# Patient Record
Sex: Male | Born: 1960 | Race: White | Hispanic: No | Marital: Married | Smoking: Current every day smoker
Health system: Southern US, Community
[De-identification: ages and names within clinical notes are randomized; demographics above are authoritative.]

## PROBLEM LIST (undated history)

## (undated) DIAGNOSIS — H905 Unspecified sensorineural hearing loss: Secondary | ICD-10-CM

## (undated) DIAGNOSIS — E559 Vitamin D deficiency, unspecified: Secondary | ICD-10-CM

## (undated) DIAGNOSIS — E119 Type 2 diabetes mellitus without complications: Secondary | ICD-10-CM

## (undated) DIAGNOSIS — K4091 Unilateral inguinal hernia, without obstruction or gangrene, recurrent: Secondary | ICD-10-CM

## (undated) DIAGNOSIS — G4730 Sleep apnea, unspecified: Secondary | ICD-10-CM

## (undated) DIAGNOSIS — T8859XA Other complications of anesthesia, initial encounter: Secondary | ICD-10-CM

## (undated) DIAGNOSIS — T4145XA Adverse effect of unspecified anesthetic, initial encounter: Secondary | ICD-10-CM

## (undated) DIAGNOSIS — K760 Fatty (change of) liver, not elsewhere classified: Secondary | ICD-10-CM

## (undated) DIAGNOSIS — E039 Hypothyroidism, unspecified: Secondary | ICD-10-CM

## (undated) HISTORY — PX: COLONOSCOPY: SHX174

## (undated) HISTORY — PX: OTHER SURGICAL HISTORY: SHX169

## (undated) HISTORY — PX: MOUTH SURGERY: SHX715

---

## 1993-12-02 HISTORY — PX: HERNIA REPAIR: SHX51

## 2012-07-04 HISTORY — PX: MYRINGOTOMY WITH TUBE PLACEMENT: SHX5663

## 2013-07-01 ENCOUNTER — Emergency Department (HOSPITAL_COMMUNITY): Payer: Worker's Compensation

## 2013-07-01 ENCOUNTER — Emergency Department (HOSPITAL_COMMUNITY)
Admission: EM | Admit: 2013-07-01 | Discharge: 2013-07-01 | Disposition: A | Discharge status: Home / Self Care | Payer: Worker's Compensation | Attending: Emergency Medicine | Admitting: Emergency Medicine

## 2013-07-01 ENCOUNTER — Encounter (HOSPITAL_COMMUNITY): Payer: Self-pay | Admitting: Emergency Medicine

## 2013-07-01 DIAGNOSIS — W19XXXA Unspecified fall, initial encounter: Secondary | ICD-10-CM

## 2013-07-01 DIAGNOSIS — W1809XA Striking against other object with subsequent fall, initial encounter: Secondary | ICD-10-CM | POA: Insufficient documentation

## 2013-07-01 DIAGNOSIS — S0101XA Laceration without foreign body of scalp, initial encounter: Secondary | ICD-10-CM

## 2013-07-01 DIAGNOSIS — S7000XA Contusion of unspecified hip, initial encounter: Secondary | ICD-10-CM | POA: Insufficient documentation

## 2013-07-01 DIAGNOSIS — Y9289 Other specified places as the place of occurrence of the external cause: Secondary | ICD-10-CM | POA: Insufficient documentation

## 2013-07-01 DIAGNOSIS — Y990 Civilian activity done for income or pay: Secondary | ICD-10-CM | POA: Insufficient documentation

## 2013-07-01 DIAGNOSIS — Y939 Activity, unspecified: Secondary | ICD-10-CM | POA: Insufficient documentation

## 2013-07-01 DIAGNOSIS — S7001XA Contusion of right hip, initial encounter: Secondary | ICD-10-CM

## 2013-07-01 DIAGNOSIS — F17200 Nicotine dependence, unspecified, uncomplicated: Secondary | ICD-10-CM | POA: Insufficient documentation

## 2013-07-01 DIAGNOSIS — S0100XA Unspecified open wound of scalp, initial encounter: Secondary | ICD-10-CM | POA: Insufficient documentation

## 2013-07-01 DIAGNOSIS — S060X1A Concussion with loss of consciousness of 30 minutes or less, initial encounter: Secondary | ICD-10-CM | POA: Insufficient documentation

## 2013-07-01 NOTE — ED Notes (Signed)
Pt with fall on ice while at work just PTA. Pt alert, oriented x4, VSS. Lac to posterior scalp. No blood thinners. +LOC with injury.

## 2013-07-01 NOTE — ED Provider Notes (Signed)
CSN: [id number]     Arrival date & time 07/01/13  1244 History   First MD Initiated Contact with Patient 07/01/13 1253     Chief Complaint  Patient presents with  . Head Injury   (Consider location/radiation/quality/duration/timing/severity/associated sxs/prior Treatment) Patient is a 52 y.o. male presenting with head injury.  Head Injury  Pt with no significant PMH reports he was working at [employer] today when he slipped on a piece of plastic covering the ice floor. He fell backwards, striking his head. Had brief LOC, back to baseline now. Complaining of moderate aching headache and mild aching pain in R hip.   History reviewed. No pertinent past medical history. History reviewed. No pertinent past surgical history. History reviewed. No pertinent family history. History  Substance Use Topics  . Smoking status: Current Every Day Smoker -- 1.00 packs/day  . Smokeless tobacco: Not on file  . Alcohol Use: Yes     Comment: occasional    Review of Systems All other systems reviewed and are negative except as noted in HPI.   Allergies  Review of patient's allergies indicates no known allergies.  Home Medications   Current Outpatient Rx  Name  Route  Sig  Dispense  Refill  . naproxen sodium (ANAPROX) 220 MG tablet   Oral   Take 440 mg by mouth every 8 (eight) hours as needed (for pain).          BP 127/80  Pulse 93  Temp(Src) 98.2 F (36.8 C) (Oral)  Resp 18  SpO2 96% Physical Exam  Nursing note and vitals reviewed. Constitutional: He is oriented to person, place, and time. He appears well-developed and well-nourished.  HENT:  Head: Normocephalic.  Posterior scalp laceration  Eyes: EOM are normal. Pupils are equal, round, and reactive to light.  Neck: Normal range of motion. Neck supple.  Cardiovascular: Normal rate, normal heart sounds and intact distal pulses.   Pulmonary/Chest: Effort normal and breath sounds normal.  Abdominal: Bowel sounds are normal. He  exhibits no distension. There is no tenderness.  Musculoskeletal: Normal range of motion. He exhibits no edema and no tenderness.  Neurological: He is alert and oriented to person, place, and time. He has normal strength. No cranial nerve deficit or sensory deficit.  Skin: Skin is warm and dry. No rash noted.  Psychiatric: He has a normal mood and affect.    ED Course  Procedures (including critical care time)  LACERATION REPAIR Performed by: Pollyann Savoy. Consent: Verbal consent obtained. Risks and benefits: risks, benefits and alternatives were discussed Patient identity confirmed: provided demographic data Time out performed prior to procedure Prepped and Draped in normal sterile fashion Wound explored Laceration Location: posterior scalp Laceration Length: 4cm No Foreign Bodies seen or palpated Anesthesia: local infiltration Local anesthetic: lidocaine 2% no epinephrine Anesthetic total: 3 ml Irrigation method: syringe Amount of cleaning: standard Skin closure: staples Number of sutures or staples: 3 Technique: staples Patient tolerance: Patient tolerated the procedure well with no immediate complications.  Labs Review Labs Reviewed - No data to display Imaging Review Dg Hip Complete Right  07/01/2013   CLINICAL DATA:  Pain status post fall  EXAM: RIGHT HIP - COMPLETE 2+ VIEW  COMPARISON:  None.  FINDINGS: There is no evidence of hip fracture or dislocation. There is no evidence of arthropathy or other focal bone abnormality.  IMPRESSION: Negative.   Electronically Signed   By: Salome Holmes M.D.   On: 07/01/2013 13:56   Ct Head Wo Contrast  07/01/2013   CLINICAL DATA:  Fall.  Trauma to head.  EXAM: CT HEAD WITHOUT CONTRAST  TECHNIQUE: Contiguous axial images were obtained from the base of the skull through the vertex without intravenous contrast.  COMPARISON:  None.  FINDINGS: No acute infarct, hemorrhage, or mass lesion is present. The ventricles are of normal  size. No significant extra-axial mass lesion or hemorrhage is evident.  A scalp hematoma and laceration is noted in the high right parietal scalp near the vertex. There is no underlying fracture.  The paranasal sinuses are clear. There is minimal fluid in the left mastoid air cells. The right mastoid air cells are somewhat sclerotic.  IMPRESSION: 1. High right parietal scalp laceration and hematoma without underlying fracture. 2. Normal CT appearance of the brain. No acute intracranial abnormality. 3. Sclerotic changes of the right mastoid suggesting a history of chronic disease.   Electronically Signed   By: Gennette Pac M.D.   On: 07/01/2013 13:46    EKG Interpretation   None       MDM   1. Fall, initial encounter   2. Scalp laceration, initial encounter   3. Concussion, with loss of consciousness of 30 minutes or less, initial encounter   4. Contusion of right hip, initial encounter    Imaging neg. Pt at baseline. Given head injury precautions and wound care instructions.     Tokiko Diefenderfer B. Bernette Mayers, MD 07/01/13 [phone]

## 2013-07-01 NOTE — ED Notes (Signed)
Pt states that the right side is tender from where he fell. Pt had a tetanus shot last year in November.

## 2013-07-01 NOTE — ED Notes (Signed)
Dressing applied to stapled area.

## 2013-07-01 NOTE — ED Notes (Addendum)
Pt complaining of pain in the right foot that he landed on when falling earlier today. Palpable raised area, slightly tender to touch. MD informed.

## 2014-04-08 ENCOUNTER — Other Ambulatory Visit: Payer: Self-pay | Admitting: Family Medicine

## 2014-04-08 DIAGNOSIS — R945 Abnormal results of liver function studies: Principal | ICD-10-CM

## 2014-04-08 DIAGNOSIS — R7989 Other specified abnormal findings of blood chemistry: Secondary | ICD-10-CM

## 2014-04-09 ENCOUNTER — Ambulatory Visit
Admission: RE | Admit: 2014-04-09 | Discharge: 2014-04-09 | Disposition: A | Discharge status: Home / Self Care | Payer: BC Managed Care – PPO | Source: Ambulatory Visit | Attending: Family Medicine | Admitting: Family Medicine

## 2014-04-09 DIAGNOSIS — R7989 Other specified abnormal findings of blood chemistry: Secondary | ICD-10-CM

## 2014-04-09 DIAGNOSIS — R945 Abnormal results of liver function studies: Principal | ICD-10-CM

## 2014-09-09 NOTE — Progress Notes (Signed)
Please put orders in Epic surgery 09-22-14 pre op 09-12-14 Thanks

## 2014-09-10 NOTE — Patient Instructions (Addendum)
Girtha RmKelly W Vertz  09/10/2014   Your procedure is scheduled on: Monday 09/22/2014  [facility]  [facility] and follow signs to               Short Stay Center at 0530 AM.  Call this number if you have problems the morning of surgery [phone]   Remember: DO NOT TAKE DIABETIC MEDICATIONS MORNING OF SURGERY!  Do not eat food or drink liquids :After Midnight.     Take these medicines the morning of surgery with A SIP OF WATER: Levothyroxine                               You may not have any metal on your body including hair pins and              piercings  Do not wear jewelry, make-up, lotions, powders or perfumes.             Do not wear nail polish.  Do not shave  48 hours prior to surgery.              Men may shave face and neck.   Do not bring valuables to the hospital. [facility] IS NOT             RESPONSIBLE   FOR VALUABLES.  Contacts, dentures or bridgework may not be worn into surgery.  Leave suitcase in the car. After surgery it may be brought to your room.     Patients discharged the day of surgery will not be allowed to drive home.  Name and phone number of your driver:  Special Instructions: N/A              Please read over the following fact sheets you were given: _____________________________________________________________________             [facility] - Preparing for [facility] Before surgery, you can play an important role.  Because skin is not sterile, your skin needs to be as free of germs as possible.  You can reduce the number of germs on your skin by washing with CHG (chlorahexidine gluconate) soap before surgery.  CHG is an antiseptic cleaner which kills germs and bonds with the skin to continue killing germs even after washing. Please DO NOT use if you have an allergy to CHG or antibacterial soaps.  If your skin becomes reddened/irritated stop using the CHG and inform your nurse when you arrive at Short Stay. Do not  shave (including legs and underarms) for at least 48 hours prior to the first CHG shower.  You may shave your face/neck. Please follow these instructions carefully:  1.  Shower with CHG Soap the night before surgery and the  morning of Surgery.  2.  If you choose to wash your hair, wash your hair first as usual with your  normal  shampoo.  3.  After you shampoo, rinse your hair and body thoroughly to remove the  shampoo.                           4.  Use CHG as you would any other liquid soap.  You can apply chg directly  to the skin and wash  Gently with a scrungie or clean washcloth.  5.  Apply the CHG Soap to your body ONLY FROM THE NECK DOWN.   Do not use on face/ open                           Wound or open sores. Avoid contact with eyes, ears mouth and genitals (private parts).                       Wash face,  Genitals (private parts) with your normal soap.             6.  Wash thoroughly, paying special attention to the area where your surgery  will be performed.  7.  Thoroughly rinse your body with warm water from the neck down.  8.  DO NOT shower/wash with your normal soap after using and rinsing off  the CHG Soap.                9.  Pat yourself dry with a clean towel.            10.  Wear clean pajamas.            11.  Place clean sheets on your bed the night of your first shower and do not  sleep with pets. Day of Surgery : Do not apply any lotions/deodorants the morning of surgery.  Please wear clean clothes to the hospital/surgery center.  FAILURE TO FOLLOW THESE INSTRUCTIONS MAY RESULT IN THE CANCELLATION OF YOUR SURGERY PATIENT SIGNATURE_________________________________  NURSE SIGNATURE__________________________________  ________________________________________________________________________   Adam Phenix  An incentive spirometer is a tool that can help keep your lungs clear and active. This tool measures how well you are filling your lungs  with each breath. Taking long deep breaths may help reverse or decrease the chance of developing breathing (pulmonary) problems (especially infection) following:  A long period of time when you are unable to move or be active. BEFORE THE PROCEDURE   If the spirometer includes an indicator to show your best effort, your nurse or respiratory therapist will set it to a desired goal.  If possible, sit up straight or lean slightly forward. Try not to slouch.  Hold the incentive spirometer in an upright position. INSTRUCTIONS FOR USE  1. Sit on the edge of your bed if possible, or sit up as far as you can in bed or on a chair. 2. Hold the incentive spirometer in an upright position. 3. Breathe out normally. 4. Place the mouthpiece in your mouth and seal your lips tightly around it. 5. Breathe in slowly and as deeply as possible, raising the piston or the ball toward the top of the column. 6. Hold your breath for 3-5 seconds or for as long as possible. Allow the piston or ball to fall to the bottom of the column. 7. Remove the mouthpiece from your mouth and breathe out normally. 8. Rest for a few seconds and repeat Steps 1 through 7 at least 10 times every 1-2 hours when you are awake. Take your time and take a few normal breaths between deep breaths. 9. The spirometer may include an indicator to show your best effort. Use the indicator as a goal to work toward during each repetition. 10. After each set of 10 deep breaths, practice coughing to be sure your lungs are clear. If you have an incision (the cut made at the time of surgery),  support your incision when coughing by placing a pillow or rolled up towels firmly against it. Once you are able to get out of bed, walk around indoors and cough well. You may stop using the incentive spirometer when instructed by your caregiver.  RISKS AND COMPLICATIONS  Take your time so you do not get dizzy or light-headed.  If you are in pain, you may need to  take or ask for pain medication before doing incentive spirometry. It is harder to take a deep breath if you are having pain. AFTER USE  Rest and breathe slowly and easily.  It can be helpful to keep track of a log of your progress. Your caregiver can provide you with a simple table to help with this. If you are using the spirometer at home, follow these instructions: [place] IF:   You are having difficultly using the spirometer.  You have trouble using the spirometer as often as instructed.  Your pain medication is not giving enough relief while using the spirometer.  You develop fever of 100.5 F (38.1 C) or higher. SEEK IMMEDIATE MEDICAL CARE IF:   You cough up bloody sputum that had not been present before.  You develop fever of 102 F (38.9 C) or greater.  You develop worsening pain at or near the incision site. MAKE SURE YOU:   Understand these instructions.  Will watch your condition.  Will get help right away if you are not doing well or get worse. Document Released: 10/31/2006 Document Revised: 09/12/2011 Document Reviewed: 01/01/2007 [facility] Patient Information 2014 [place], [place].   ________________________________________________________________________

## 2014-09-12 ENCOUNTER — Other Ambulatory Visit: Payer: Self-pay

## 2014-09-12 ENCOUNTER — Encounter (HOSPITAL_COMMUNITY): Payer: Self-pay

## 2014-09-12 ENCOUNTER — Encounter (HOSPITAL_COMMUNITY)
Admission: RE | Admit: 2014-09-12 | Discharge: 2014-09-12 | Disposition: A | Discharge status: Home / Self Care | Payer: Managed Care, Other (non HMO) | Source: Ambulatory Visit | Attending: Surgery | Admitting: Surgery

## 2014-09-12 DIAGNOSIS — K4090 Unilateral inguinal hernia, without obstruction or gangrene, not specified as recurrent: Secondary | ICD-10-CM | POA: Insufficient documentation

## 2014-09-12 DIAGNOSIS — Z01810 Encounter for preprocedural cardiovascular examination: Secondary | ICD-10-CM | POA: Insufficient documentation

## 2014-09-12 DIAGNOSIS — Z01812 Encounter for preprocedural laboratory examination: Secondary | ICD-10-CM | POA: Insufficient documentation

## 2014-09-12 HISTORY — DX: Type 2 diabetes mellitus without complications: E11.9

## 2014-09-12 HISTORY — DX: Fatty (change of) liver, not elsewhere classified: K76.0

## 2014-09-12 HISTORY — DX: Hypothyroidism, unspecified: E03.9

## 2014-09-12 LAB — BASIC METABOLIC PANEL
Anion gap: 5 (ref 5–15)
BUN: 10 mg/dL (ref 6–23)
CHLORIDE: 101 mmol/L (ref 96–112)
CO2: 28 mmol/L (ref 19–32)
CREATININE: 0.95 mg/dL (ref 0.50–1.35)
Calcium: 9.7 mg/dL (ref 8.4–10.5)
GFR calc Af Amer: >90 mL/min (ref >90)
GFR calc non Af Amer: >90 mL/min (ref >90)
GLUCOSE: 165 mg/dL — AB (ref 70–99)
POTASSIUM: 5.1 mmol/L (ref 3.5–5.1)
Sodium: 134 mmol/L — ABNORMAL LOW (ref 135–145)

## 2014-09-12 LAB — CBC
HEMATOCRIT: 47.7 % (ref 39.0–52.0)
Hemoglobin: 16 g/dL (ref 13.0–17.0)
MCH: 30.9 pg (ref 26.0–34.0)
MCHC: 33.5 g/dL (ref 30.0–36.0)
MCV: 92.3 fL (ref 78.0–100.0)
Platelets: 186 10*3/uL (ref 150–400)
RBC: 5.17 MIL/uL (ref 4.22–5.81)
RDW: 12.7 % (ref 11.5–15.5)
WBC: 7.8 10*3/uL (ref 4.0–10.5)

## 2014-09-12 NOTE — Progress Notes (Signed)
   09/12/14 0849  OBSTRUCTIVE SLEEP APNEA  Have you ever been diagnosed with sleep apnea through a sleep study? No  Do you snore loudly (loud enough to be heard through closed doors)?  1  Do you often feel tired, fatigued, or sleepy during the daytime? 1  Has anyone observed you stop breathing during your sleep? 0  Do you have, or are you being treated for high blood pressure? 0  BMI more than 35 kg/m2? 0  Age over 54 years old? 1  Neck circumference greater than 40 cm/16 inches? 1  Gender: 1  Obstructive Sleep Apnea Score 5

## 2014-09-15 NOTE — Progress Notes (Signed)
[facility] as they had their pre-op appointment Friday 09/12/2014. I put in orders per Anesthesia for labs, EKG and Chest x-ray. Thank you!

## 2014-09-18 ENCOUNTER — Ambulatory Visit: Payer: Self-pay | Admitting: Surgery

## 2014-09-21 ENCOUNTER — Ambulatory Visit: Payer: Self-pay | Admitting: Surgery

## 2014-09-21 MED ORDER — DEXTROSE 5 % IV SOLN
3.0000 g | INTRAVENOUS | Status: DC
  Filled 2014-09-21: qty 3000

## 2014-09-21 NOTE — H&P (Signed)
Todd Mcneil 08/01/2014 11:31 AM Location: [facility] Patient #: [id number] DOB: 02/22/1961 Married / Language: English / Race: White Male  History of Present Illness (Todd Lasker B. [name] Todd Mcneil; 08/01/2014 12:14 PM) Patient words: New Patient- recurrent LIH Hernia.  The patient is a 53 year old male who presents for an evaluation of a hernia. The onset of the hernia has been acute (noticed it about 3 years ago and was following openbilateral hernia repairs 19 years ago.) He inquired about having a vasectomy at the same time. He has a recurrent left inguinal hernia. He wasn't clear who did the surgery but was done at the old [facility]. On physical exam he has a small bulge in the left groin that reduces partially. He says at times it will pole and is a little uncomfortable. There is no evidence of recurrent The Right Side. I Discussed Possibly Doing Him with a Preperitoneal Laparoscopic Approach Possibly Him to Do an Open Repair Depending on the Findings. He like to Have a Vasectomy at the Same Time and Maybe We Can Go Ahead and Do Bilateral Vasectomies. We Will Talk about That at the Time of Surgery. We gave him a booklet on hernia repair. I discussed the procedure with him in some detail. Plan laparoscopic possible open recurrent left inguinal herniorrhaphy   Other Problems (Todd Sakowski, Todd Mcneil; 08/01/2014 11:31 AM) Inguinal Hernia Thyroid Disease  Past Surgical History (Todd Sakowski, Todd Mcneil; 08/01/2014 11:31 AM) Open Inguinal Hernia Surgery Bilateral. Oral Surgery  Diagnostic Studies History (Todd Sakowski, Todd Mcneil; 08/01/2014 11:31 AM) Colonoscopy 1-5 years ago  Allergies (Todd Sakowski, Todd Mcneil; 08/01/2014 11:32 AM) No Known Drug Allergies01/29/2016  Medication History (Todd Sakowski, Todd Mcneil; 08/01/2014 11:33 AM) MetFORMIN HCl (500MG Tablet, Oral two times daily) Active. Levothyroxine Sodium (88MCG Tablet, Oral daily) Active. Multivitamins (Oral  daily) Active.  Social History (Todd Sakowski, Todd Mcneil; 08/01/2014 11:31 AM) Alcohol use Occasional alcohol use. Caffeine use Coffee. Illicit drug use Prefer to discuss with provider. Tobacco use Current some day smoker.  Family History (Todd Sakowski, Todd Mcneil; 08/01/2014 11:31 AM) Arthritis Father. Cancer Father, Mother. Heart Disease Father. Heart disease in male family member before age 55 Hypertension Mother. Kidney Disease Mother. Melanoma Father.  Review of Systems (Todd Sakowski Todd Mcneil; 08/01/2014 11:31 AM) General Not Present- Appetite Loss, Chills, Fatigue, Fever, Night Sweats, Weight Gain and Weight Loss. Skin Not Present- Change in Wart/Mole, Dryness, Hives, Jaundice, New Lesions, Non-Healing Wounds, Rash and Ulcer. HEENT Present- Seasonal Allergies and Wears glasses/contact lenses. Not Present- Earache, Hearing Loss, Hoarseness, Nose Bleed, Oral Ulcers, Ringing in the Ears, Sinus Pain, Sore Throat, Visual Disturbances and Yellow Eyes. Respiratory Present- Snoring. Not Present- Bloody sputum, Chronic Cough, Difficulty Breathing and Wheezing. Breast Not Present- Breast Mass, Breast Pain, Nipple Discharge and Skin Changes. Cardiovascular Not Present- Chest Pain, Difficulty Breathing Lying Down, Leg Cramps, Palpitations, Rapid Heart Rate, Shortness of Breath and Swelling of Extremities. Gastrointestinal Present- Abdominal Pain and Excessive gas. Not Present- Bloating, Bloody Stool, Change in Bowel Habits, Chronic diarrhea, Constipation, Difficulty Swallowing, Gets full quickly at meals, Hemorrhoids, Indigestion, Nausea, Rectal Pain and Vomiting. Musculoskeletal Not Present- Back Pain, Joint Pain, Joint Stiffness, Muscle Pain, Muscle Weakness and Swelling of Extremities. Neurological Not Present- Decreased Memory, Fainting, Headaches, Numbness, Seizures, Tingling, Tremor, Trouble walking and Weakness. Psychiatric Not Present- Anxiety, Bipolar, Change in Sleep Pattern, Depression,  Fearful and Frequent crying. Endocrine Present- New Diabetes. Not Present- Cold Intolerance, Excessive Hunger, Hair Changes, Heat Intolerance and Hot flashes. Hematology Not Present- Easy Bruising,   Excessive bleeding, Gland problems, HIV and Persistent Infections.   Vitals (Todd Sakowski Todd Mcneil; 08/01/2014 11:32 AM) 08/01/2014 11:31 AM Weight: 281 lb Height: 76in Body Surface Area: 2.61 m Body Mass Index: 34.2 kg/m Temp.: 97.8F(Oral)  Pulse: 72 (Regular)  BP: 130/82 (Sitting, Left Arm, Standard)    Physical Exam (Todd Senat B. Sascha Baugher Todd Mcneil; 08/01/2014 12:13 PM) The physical exam findings are as follows: Note:HEENT-normal Neck-no bruits Chest-clear Heart-SR without murmurs Abdomen nontender GU-recurrent left inguinal hernia-prior bilateral open inguinal hernia repairs Ext-FROM Neuro-alert and oriented x 3.    Assessment & Plan (Todd Zagami B. 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  The patient doesn't want a vasectomy at this time.    

## 2014-09-22 ENCOUNTER — Encounter (HOSPITAL_COMMUNITY): Payer: Self-pay | Admitting: *Deleted

## 2014-09-22 ENCOUNTER — Ambulatory Visit (HOSPITAL_COMMUNITY): Payer: Managed Care, Other (non HMO) | Admitting: Anesthesiology

## 2014-09-22 ENCOUNTER — Ambulatory Visit (HOSPITAL_COMMUNITY)
Admission: RE | Admit: 2014-09-22 | Discharge: 2014-09-22 | Disposition: A | Discharge status: Home / Self Care | Payer: Managed Care, Other (non HMO) | Source: Ambulatory Visit | Attending: Surgery | Admitting: Surgery

## 2014-09-22 ENCOUNTER — Encounter (HOSPITAL_COMMUNITY)
Admission: RE | Disposition: A | Discharge status: Home / Self Care | Payer: Self-pay | Source: Ambulatory Visit | Attending: Surgery

## 2014-09-22 DIAGNOSIS — F1099 Alcohol use, unspecified with unspecified alcohol-induced disorder: Secondary | ICD-10-CM | POA: Insufficient documentation

## 2014-09-22 DIAGNOSIS — F1590 Other stimulant use, unspecified, uncomplicated: Secondary | ICD-10-CM | POA: Insufficient documentation

## 2014-09-22 DIAGNOSIS — E119 Type 2 diabetes mellitus without complications: Secondary | ICD-10-CM | POA: Diagnosis not present

## 2014-09-22 DIAGNOSIS — F17210 Nicotine dependence, cigarettes, uncomplicated: Secondary | ICD-10-CM | POA: Insufficient documentation

## 2014-09-22 DIAGNOSIS — K4031 Unilateral inguinal hernia, with obstruction, without gangrene, recurrent: Secondary | ICD-10-CM | POA: Insufficient documentation

## 2014-09-22 HISTORY — PX: INGUINAL HERNIA REPAIR: SHX194

## 2014-09-22 LAB — GLUCOSE, CAPILLARY
Glucose-Capillary: 226 mg/dL — ABNORMAL HIGH (ref 70–99)
Glucose-Capillary: 229 mg/dL — ABNORMAL HIGH (ref 70–99)

## 2014-09-22 SURGERY — REPAIR, HERNIA, INGUINAL, LAPAROSCOPIC
Anesthesia: General | Site: Abdomen | Laterality: Left

## 2014-09-22 MED ORDER — DEXAMETHASONE SODIUM PHOSPHATE 10 MG/ML IJ SOLN
INTRAMUSCULAR | Status: AC
  Filled 2014-09-22: qty 1

## 2014-09-22 MED ORDER — FENTANYL CITRATE 0.05 MG/ML IJ SOLN
INTRAMUSCULAR | Status: AC
  Filled 2014-09-22: qty 5

## 2014-09-22 MED ORDER — HEPARIN SODIUM (PORCINE) 5000 UNIT/ML IJ SOLN: 5000.0000 [IU] | Freq: Once | INTRAMUSCULAR | Status: DC

## 2014-09-22 MED ORDER — LACTATED RINGERS IR SOLN
Status: DC | PRN
  Administered 2014-09-22: 1000 mL

## 2014-09-22 MED ORDER — ONDANSETRON HCL 4 MG/2ML IJ SOLN
INTRAMUSCULAR | Status: AC
  Filled 2014-09-22: qty 2

## 2014-09-22 MED ORDER — ROCURONIUM BROMIDE 100 MG/10ML IV SOLN
INTRAVENOUS | Status: DC | PRN
  Administered 2014-09-22 (×2): 10 mg via INTRAVENOUS
  Administered 2014-09-22: 30 mg via INTRAVENOUS
  Administered 2014-09-22: 10 mg via INTRAVENOUS

## 2014-09-22 MED ORDER — ROCURONIUM BROMIDE 100 MG/10ML IV SOLN
INTRAVENOUS | Status: AC
  Filled 2014-09-22: qty 1

## 2014-09-22 MED ORDER — LACTATED RINGERS IV SOLN: INTRAVENOUS | Status: DC

## 2014-09-22 MED ORDER — HEPARIN SODIUM (PORCINE) 5000 UNIT/ML IJ SOLN
5000.0000 [IU] | Freq: Once | INTRAMUSCULAR | Status: AC
  Administered 2014-09-22: 5000 [IU] via SUBCUTANEOUS
  Filled 2014-09-22: qty 1

## 2014-09-22 MED ORDER — LACTATED RINGERS IV SOLN
INTRAVENOUS | Status: DC | PRN
Start: 2014-09-22 — End: 2014-09-22
  Administered 2014-09-22 (×2): via INTRAVENOUS

## 2014-09-22 MED ORDER — HYDROMORPHONE HCL 1 MG/ML IJ SOLN
0.2500 mg | INTRAMUSCULAR | Status: DC | PRN
  Administered 2014-09-22 (×4): 0.5 mg via INTRAVENOUS

## 2014-09-22 MED ORDER — GLYCOPYRROLATE 0.2 MG/ML IJ SOLN
INTRAMUSCULAR | Status: AC
  Filled 2014-09-22: qty 2

## 2014-09-22 MED ORDER — PROPOFOL 10 MG/ML IV BOLUS
INTRAVENOUS | Status: DC | PRN
  Administered 2014-09-22: 200 mg via INTRAVENOUS

## 2014-09-22 MED ORDER — MIDAZOLAM HCL 2 MG/2ML IJ SOLN
INTRAMUSCULAR | Status: AC
  Filled 2014-09-22: qty 2

## 2014-09-22 MED ORDER — 0.9 % SODIUM CHLORIDE (POUR BTL) OPTIME
TOPICAL | Status: DC | PRN
  Administered 2014-09-22: 1000 mL

## 2014-09-22 MED ORDER — BUPIVACAINE LIPOSOME 1.3 % IJ SUSP
20.0000 mL | Freq: Once | INTRAMUSCULAR | Status: DC
  Filled 2014-09-22: qty 20

## 2014-09-22 MED ORDER — HYDROMORPHONE HCL 1 MG/ML IJ SOLN
INTRAMUSCULAR | Status: AC
  Filled 2014-09-22: qty 1

## 2014-09-22 MED ORDER — DEXTROSE 5 % IV SOLN
3.0000 g | INTRAVENOUS | Status: AC
  Administered 2014-09-22: 3 g via INTRAVENOUS
  Filled 2014-09-22: qty 3000

## 2014-09-22 MED ORDER — SUCCINYLCHOLINE CHLORIDE 20 MG/ML IJ SOLN
INTRAMUSCULAR | Status: DC | PRN
  Administered 2014-09-22: 100 mg via INTRAVENOUS

## 2014-09-22 MED ORDER — FENTANYL CITRATE 0.05 MG/ML IJ SOLN
INTRAMUSCULAR | Status: DC | PRN
  Administered 2014-09-22: 100 ug via INTRAVENOUS
  Administered 2014-09-22 (×8): 50 ug via INTRAVENOUS

## 2014-09-22 MED ORDER — PROPOFOL 10 MG/ML IV BOLUS
INTRAVENOUS | Status: AC
  Filled 2014-09-22: qty 20

## 2014-09-22 MED ORDER — GLYCOPYRROLATE 0.2 MG/ML IJ SOLN
INTRAMUSCULAR | Status: DC | PRN
  Administered 2014-09-22: 0.6 mg via INTRAVENOUS

## 2014-09-22 MED ORDER — CHLORHEXIDINE GLUCONATE 4 % EX LIQD: 1.0000 "application " | Freq: Once | CUTANEOUS | Status: DC

## 2014-09-22 MED ORDER — SODIUM CHLORIDE 0.9 % IJ SOLN: 3.0000 mL | INTRAMUSCULAR | Status: DC | PRN

## 2014-09-22 MED ORDER — ACETAMINOPHEN 325 MG PO TABS: 650.0000 mg | ORAL_TABLET | ORAL | Status: DC | PRN

## 2014-09-22 MED ORDER — ONDANSETRON HCL 4 MG/2ML IJ SOLN: 4.0000 mg | Freq: Once | INTRAMUSCULAR | Status: DC | PRN

## 2014-09-22 MED ORDER — SODIUM CHLORIDE 0.9 % IJ SOLN: 3.0000 mL | Freq: Two times a day (BID) | INTRAMUSCULAR | Status: DC

## 2014-09-22 MED ORDER — LIDOCAINE HCL (CARDIAC) 20 MG/ML IV SOLN
INTRAVENOUS | Status: AC
  Filled 2014-09-22: qty 5

## 2014-09-22 MED ORDER — MIDAZOLAM HCL 5 MG/5ML IJ SOLN
INTRAMUSCULAR | Status: DC | PRN
  Administered 2014-09-22 (×2): 1 mg via INTRAVENOUS

## 2014-09-22 MED ORDER — ACETAMINOPHEN 650 MG RE SUPP
650.0000 mg | RECTAL | Status: DC | PRN
  Filled 2014-09-22: qty 1

## 2014-09-22 MED ORDER — OXYCODONE HCL 5 MG PO TABS
5.0000 mg | ORAL_TABLET | ORAL | Status: DC | PRN
  Administered 2014-09-22 (×2): 5 mg via ORAL
  Filled 2014-09-22 (×2): qty 1

## 2014-09-22 MED ORDER — CISATRACURIUM BESYLATE 20 MG/10ML IV SOLN
INTRAVENOUS | Status: AC
Start: 2014-09-22 — End: 2014-09-22
  Filled 2014-09-22: qty 10

## 2014-09-22 MED ORDER — DEXAMETHASONE SODIUM PHOSPHATE 10 MG/ML IJ SOLN
INTRAMUSCULAR | Status: DC | PRN
  Administered 2014-09-22: 10 mg via INTRAVENOUS

## 2014-09-22 MED ORDER — BUPIVACAINE HCL (PF) 0.25 % IJ SOLN
INTRAMUSCULAR | Status: DC | PRN
  Administered 2014-09-22: 20 mL

## 2014-09-22 MED ORDER — NEOSTIGMINE METHYLSULFATE 10 MG/10ML IV SOLN
INTRAVENOUS | Status: DC | PRN
  Administered 2014-09-22: 4 mg via INTRAVENOUS

## 2014-09-22 MED ORDER — NEOSTIGMINE METHYLSULFATE 10 MG/10ML IV SOLN
INTRAVENOUS | Status: AC
  Filled 2014-09-22: qty 1

## 2014-09-22 MED ORDER — MEPERIDINE HCL 50 MG/ML IJ SOLN: 6.2500 mg | INTRAMUSCULAR | Status: DC | PRN

## 2014-09-22 MED ORDER — SODIUM CHLORIDE 0.9 % IV SOLN: 250.0000 mL | INTRAVENOUS | Status: DC | PRN

## 2014-09-22 MED ORDER — HYDROMORPHONE HCL 2 MG/ML IJ SOLN
INTRAMUSCULAR | Status: AC
  Filled 2014-09-22: qty 1

## 2014-09-22 MED ORDER — BUPIVACAINE HCL (PF) 0.25 % IJ SOLN
INTRAMUSCULAR | Status: AC
  Filled 2014-09-22: qty 30

## 2014-09-22 MED ORDER — HYDROCODONE-ACETAMINOPHEN 5-325 MG PO TABS: 1.0000 | ORAL_TABLET | ORAL | Status: DC | PRN

## 2014-09-22 MED ORDER — ONDANSETRON HCL 4 MG/2ML IJ SOLN
INTRAMUSCULAR | Status: DC | PRN
  Administered 2014-09-22: 4 mg via INTRAVENOUS

## 2014-09-22 MED ORDER — HYDROMORPHONE HCL 1 MG/ML IJ SOLN
INTRAMUSCULAR | Status: DC
Start: 2014-09-22 — End: 2014-09-22
  Filled 2014-09-22: qty 1

## 2014-09-22 SURGICAL SUPPLY — 45 items
APL SKNCLS STERI-STRIP NONHPOA (GAUZE/BANDAGES/DRESSINGS)
BENZOIN TINCTURE PRP APPL 2/3 (GAUZE/BANDAGES/DRESSINGS) ×1 IMPLANT
CABLE HIGH FREQUENCY MONO STRZ (ELECTRODE) ×2 IMPLANT
COVER SURGICAL LIGHT HANDLE (MISCELLANEOUS) ×1 IMPLANT
DECANTER SPIKE VIAL GLASS SM (MISCELLANEOUS) ×1 IMPLANT
DEVICE SECURE STRAP 25 ABSORB (INSTRUMENTS) IMPLANT
DISSECT BALLN SPACEMKR + OVL (BALLOONS) ×4
DISSECTOR BALLN SPACEMKR + OVL (BALLOONS) ×1 IMPLANT
DISSECTOR BLUNT TIP ENDO 5MM (MISCELLANEOUS) ×2 IMPLANT
DRAPE LAPAROSCOPIC ABDOMINAL (DRAPES) ×2 IMPLANT
DRSG TEGADERM 2-3/8X2-3/4 SM (GAUZE/BANDAGES/DRESSINGS) IMPLANT
ELECT REM PT RETURN 9FT ADLT (ELECTROSURGICAL) ×2
ELECTRODE REM PT RTRN 9FT ADLT (ELECTROSURGICAL) ×1 IMPLANT
GLOVE BIOGEL M 8.0 STRL (GLOVE) ×2 IMPLANT
GLOVE BIOGEL PI IND STRL 7.0 (GLOVE) ×1 IMPLANT
GLOVE BIOGEL PI IND STRL 7.5 (GLOVE) IMPLANT
GLOVE BIOGEL PI INDICATOR 7.0 (GLOVE) ×2
GLOVE BIOGEL PI INDICATOR 7.5 (GLOVE) ×1
GLOVE SURG SS PI 6.5 STRL IVOR (GLOVE) ×2 IMPLANT
GLOVE SURG SS PI 7.5 STRL IVOR (GLOVE) ×1 IMPLANT
GOWN SPEC L4 XLG W/TWL (GOWN DISPOSABLE) ×2 IMPLANT
GOWN STRL REUS W/TWL LRG LVL3 (GOWN DISPOSABLE) ×1 IMPLANT
GOWN STRL REUS W/TWL XL LVL3 (GOWN DISPOSABLE) ×6 IMPLANT
KIT BASIN OR (CUSTOM PROCEDURE TRAY) ×2 IMPLANT
LIQUID BAND (GAUZE/BANDAGES/DRESSINGS) ×1 IMPLANT
MARKER SKIN DUAL TIP RULER LAB (MISCELLANEOUS) ×2 IMPLANT
MESH 3DMAX LIGHT 4.1X6.2 LT LR (Mesh General) ×1 IMPLANT
NDL INSUFFLATION 14GA 120MM (NEEDLE) IMPLANT
NEEDLE INSUFFLATION 14GA 120MM (NEEDLE) IMPLANT
NS IRRIG 1000ML POUR BTL (IV SOLUTION) ×2 IMPLANT
PENCIL BUTTON HOLSTER BLD 10FT (ELECTRODE) ×2 IMPLANT
SCISSORS LAP 5X35 DISP (ENDOMECHANICALS) ×2 IMPLANT
SET IRRIG TUBING LAPAROSCOPIC (IRRIGATION / IRRIGATOR) ×2 IMPLANT
SOLUTION ANTI FOG 6CC (MISCELLANEOUS) IMPLANT
STRIP CLOSURE SKIN 1/2X4 (GAUZE/BANDAGES/DRESSINGS) ×1 IMPLANT
SUT VIC AB 3-0 SH 27 (SUTURE)
SUT VIC AB 3-0 SH 27XBRD (SUTURE) IMPLANT
SUT VIC AB 4-0 SH 18 (SUTURE) ×2 IMPLANT
SYR 30ML LL (SYRINGE) ×2 IMPLANT
TACKER 5MM HERNIA 3.5CML NAB (ENDOMECHANICALS) ×2 IMPLANT
TRAY FOLEY CATH 14FRSI W/METER (CATHETERS) ×1 IMPLANT
TRAY LAPAROSCOPIC (CUSTOM PROCEDURE TRAY) ×2 IMPLANT
TROCAR BLADELESS OPT 5 75 (ENDOMECHANICALS) ×4 IMPLANT
TUBING FILTER THERMOFLATOR (ELECTROSURGICAL) ×2 IMPLANT
TUBING INSUFFLATION 10FT LAP (TUBING) ×2 IMPLANT

## 2014-09-22 NOTE — Discharge Instructions (Signed)

## 2014-09-22 NOTE — Transfer of Care (Signed)
Immediate Anesthesia Transfer of Care Note  Patient: Todd Mcneil  Procedure(s) Performed: Procedure(s): LAPAROSCOPIC POSSIBLE OPEN LEFT INGUINAL HERNIA (Left)  Patient Location: PACU  Anesthesia Type:General  Level of Consciousness: awake, alert , oriented and patient cooperative  Airway & Oxygen Therapy: Patient Spontanous Breathing and Patient connected to face mask oxygen  Post-op Assessment: Report given to RN and Post -op Vital signs reviewed and stable  Post vital signs: Reviewed and stable  Last Vitals:  Filed Vitals:   09/22/14 0550  BP: 126/86  Pulse: 78  Temp: 36.9 C  Resp: 16    Complications: No apparent anesthesia complications

## 2014-09-22 NOTE — H&P (View-Only) (Signed)
Todd Mcneil 08/01/2014 11:31 AM Location: [facility] Patient #: [id number] DOB: 02-27-1961 Married / Language: [place] / Race: White Male  History of Present Illness Todd Mcneil B. [name] MD; 08/01/2014 12:14 PM) Patient words: New Patient- recurrent LIH Hernia.  The patient is a 54 year old male who presents for an evaluation of a hernia. The onset of the hernia has been acute (noticed it about 3 years ago and was following openbilateral hernia repairs 19 years ago.) He inquired about having a vasectomy at the same time. He has a recurrent left inguinal hernia. He wasn't clear who did the surgery but was done at the old [facility] long outpatient surgical [facility]. On physical exam he has a small bulge in the left groin that reduces partially. He says at times it will pole and is a little uncomfortable. There is no evidence of recurrent The Right Side. I Discussed Possibly Doing Him with a Preperitoneal Laparoscopic Approach Possibly Him to Do an Open Repair Depending on the Findings. He like to Have a Vasectomy at the Same Time and Maybe We Can Go Ahead and Do Bilateral Vasectomies. We Will Talk about That at the Time of Surgery. We gave him a booklet on hernia repair. I discussed the procedure with him in some detail. Plan laparoscopic possible open recurrent left inguinal herniorrhaphy   Other Problems Valli Glance, LPN; 0/34/7425 95:63 AM) Inguinal Hernia Thyroid Disease  Past Surgical History Valli Glance, LPN; 8/75/6433 29:51 AM) Open Inguinal Hernia Surgery Bilateral. Oral Surgery  Diagnostic Studies History Valli Glance, LPN; 8/84/1660 63:01 AM) Colonoscopy 1-5 years ago  Allergies Valli Glance, LPN; 12/03/930 35:57 AM) No Known Drug Allergies01/29/2016  Medication History Valli Glance, LPN; 09/22/252 27:06 AM) MetFORMIN HCl (  Tablet, Oral two times daily) Active. Levothyroxine Sodium ( Tablet, Oral daily) Active. Multivitamins (Oral  daily) Active.  Social History Valli Glance, LPN; 2/37/6283 15:17 AM) Alcohol use Occasional alcohol use. Caffeine use Coffee. Illicit drug use Prefer to discuss with provider. Tobacco use Current some day smoker.  Family History Valli Glance, LPN; 12/18/735 10:62 AM) Arthritis Father. Cancer Father, Mother. Heart Disease Father. Heart disease in male family member before age 6 Hypertension Mother. Kidney Disease Mother. Melanoma Father.  Review of Systems Todd Mcneil [facility] LPN; 6/94/8546 27:03 AM) General Not Present- Appetite Loss, Chills, Fatigue, Fever, Night Sweats, Weight Gain and Weight Loss. Skin Not Present- Change in Wart/Mole, Dryness, Hives, Jaundice, New Lesions, Non-Healing Wounds, Rash and Ulcer. HEENT Present- Seasonal Allergies and Wears glasses/contact lenses. Not Present- Earache, Hearing Loss, Hoarseness, Nose Bleed, Oral Ulcers, Ringing in the Ears, Sinus Pain, Sore Throat, Visual Disturbances and Yellow Eyes. Respiratory Present- Snoring. Not Present- Bloody sputum, Chronic Cough, Difficulty Breathing and Wheezing. Breast Not Present- Breast Mass, Breast Pain, Nipple Discharge and Skin Changes. Cardiovascular Not Present- Chest Pain, Difficulty Breathing Lying Down, Leg Cramps, Palpitations, Rapid Heart Rate, Shortness of Breath and Swelling of Extremities. Gastrointestinal Present- Abdominal Pain and Excessive gas. Not Present- Bloating, Bloody Stool, Change in Bowel Habits, Chronic diarrhea, Constipation, Difficulty Swallowing, Gets full quickly at meals, Hemorrhoids, Indigestion, Nausea, Rectal Pain and Vomiting. Musculoskeletal Not Present- Back Pain, Joint Pain, Joint Stiffness, Muscle Pain, Muscle Weakness and Swelling of Extremities. Neurological Not Present- Decreased Memory, Fainting, Headaches, Numbness, Seizures, Tingling, Tremor, Trouble walking and Weakness. Psychiatric Not Present- Anxiety, Bipolar, Change in Sleep Pattern, Depression,  Fearful and Frequent crying. Endocrine Present- New Diabetes. Not Present- Cold Intolerance, Excessive Hunger, Hair Changes, Heat Intolerance and Hot flashes. Hematology Not Present- Easy Bruising,  Excessive bleeding, Gland problems, HIV and Persistent Infections.   Vitals Todd Mcneil([facility] L[facility]; 8/65/78461/29/2016 96:2911:32 AM) 08/01/2014 11:31 AM Weight: 281 lb Height: 76in Body Surface Area: 2.61 m Body Mass Index: 34.2 kg/m Temp.: 97.81F(Oral)  Pulse: 72 (Regular)  BP: 130/82 (Sitting, Left Arm, Standard)    Physical Exam (Ryu Cerreta B. Daphine DeutscherMartin MD; 08/01/2014 12:13 PM) The physical exam findings are as follows: Note:HEENT-normal Neck-no bruits Chest-clear Heart-SR without murmurs Abdomen nontender GU-recurrent left inguinal hernia-prior bilateral open inguinal hernia repairs Ext-FROM Neuro-alert and oriented x 3.    Assessment & Plan Todd Mcneil(Amayrany Cafaro B. Daphine DeutscherMartin MD; 08/01/2014 12:15 PM) RECURRENT LEFT INGUINAL HERNIA (550.91  K40.91) Impression: will schedule laparoscopic LIH possible open repair with mesh.  The patient doesn't want a vasectomy at this time.

## 2014-09-22 NOTE — Brief Op Note (Signed)
09/22/2014  9:23 AM  PATIENT:  Girtha RmKelly W Cullimore  54 y.o. male  PRE-OPERATIVE DIAGNOSIS:  RECURRENT LEFT INGUINAL HERNIA  POST-OPERATIVE DIAGNOSIS:  RECURRENT LEFT incarcerated direct INGUINAL HERNIA  PROCEDURE:  Laparoscopic preperitoneal repair of incarerated direct LIH  SURGEON:  Surgeon(s) and Role:    * Luretha MurphyMatthew Ulices Maack, MD - Primary  PHYSICIAN ASSISTANT:   ASSISTANTS: none   ANESTHESIA:   none  EBL:  Total I/O In: 1000 [I.V.:1000] Out: 150 [Urine:100; Blood:50]  BLOOD ADMINISTERED:none  DRAINS: none   LOCAL MEDICATIONS USED:  MARCAINE     SPECIMEN:  No Specimen  DISPOSITION OF SPECIMEN:  N/A  COUNTS:  YES  TOURNIQUET:  * No tourniquets in log *  DICTATION: .Dragon Dictation and Other Dictation: Dictation Number [id number]  PLAN OF CARE: Discharge to home after PACU  PATIENT DISPOSITION:  PACU - hemodynamically stable.   Delay start of Pharmacological VTE agent (>24hrs) due to surgical blood loss or risk of bleeding: not applicable

## 2014-09-22 NOTE — Anesthesia Preprocedure Evaluation (Signed)
Anesthesia Evaluation  Patient identified by MRN, date of birth, ID band Patient awake    Reviewed: Allergy & Precautions, NPO status , Patient's Chart, lab work & pertinent test results  Airway Mallampati: II  TM Distance: >3 FB Neck ROM: Full    Dental   Pulmonary Current Smoker,          Cardiovascular     Neuro/Psych    GI/Hepatic   Endo/Other  diabetes, Type 2, Oral Hypoglycemic Agents  Renal/GU      Musculoskeletal   Abdominal   Peds  Hematology   Anesthesia Other Findings   Reproductive/Obstetrics                             Anesthesia Physical Anesthesia Plan  ASA: II  Anesthesia Plan: General   Post-op Pain Management:    Induction: Intravenous  Airway Management Planned: Oral ETT  Additional Equipment:   Intra-op Plan:   Post-operative Plan: Extubation in OR  Informed Consent: I have reviewed the patients History and Physical, chart, labs and discussed the procedure including the risks, benefits and alternatives for the proposed anesthesia with the patient or authorized representative who has indicated his/her understanding and acceptance.     Plan Discussed with: CRNA and Surgeon  Anesthesia Plan Comments:         Anesthesia Quick Evaluation

## 2014-09-22 NOTE — Anesthesia Procedure Notes (Addendum)
Procedure Name: Intubation Date/Time: 09/22/2014 7:24 AM Performed by: Delphia GratesHANDLER, Jann Milkovich Pre-anesthesia Checklist: Patient identified, Emergency Drugs available, Suction available and Patient being monitored Patient Re-evaluated:Patient Re-evaluated prior to inductionOxygen Delivery Method: Circle system utilized Intubation Type: IV induction Laryngoscope Size: Mac and 4 Grade View: Grade I Tube type: Oral Tube size: 7.5 mm Number of attempts: 1 Airway Equipment and Method: Stylet Placement Confirmation: ETT inserted through vocal cords under direct vision and breath sounds checked- equal and bilateral Secured at: 21 cm Tube secured with: Tape Dental Injury: Teeth and Oropharynx as per pre-operative assessment  Comments: Poor dentition, unchanged after intubation.

## 2014-09-22 NOTE — Anesthesia Postprocedure Evaluation (Signed)
Anesthesia Post Note  Patient: Todd Mcneil  Procedure(s) Performed: Procedure(s) (LRB): LAPAROSCOPIC POSSIBLE OPEN LEFT INGUINAL HERNIA (Left)  Anesthesia type: general  Patient location: PACU  Post pain: Pain level controlled  Post assessment: Patient's Cardiovascular Status Stable  Last Vitals:  Filed Vitals:   09/22/14 1010  BP: 169/100  Pulse: 78  Temp: 36.7 C  Resp: 18    Post vital signs: Reviewed and stable  Level of consciousness: sedated  Complications: No apparent anesthesia complications

## 2014-09-22 NOTE — Interval H&P Note (Signed)
History and Physical Interval Note:  09/22/2014 6:59 AM  Todd Mcneil  has presented today for surgery, with the diagnosis of RECURRENT LEFT INGUINAL HERNIA  The various methods of treatment have been discussed with the patient and family. After consideration of risks, benefits and other options for treatment, the patient has consented to  Procedure(s): LAPAROSCOPIC POSSIBLE OPEN LEFT INGUINAL HERNIA (Left) as a surgical intervention .  The patient's history has been reviewed, patient examined, no change in status, stable for surgery.  I have reviewed the patient's chart and labs.  Questions were answered to the patient's satisfaction.     Amyr Sluder B

## 2014-09-23 ENCOUNTER — Encounter (HOSPITAL_COMMUNITY): Payer: Self-pay | Admitting: Surgery

## 2014-09-23 NOTE — Op Note (Signed)
NAMEDEVRON, COHICK             ACCOUNT NO.:  [id number]  MEDICAL RECORD NO.:  [id number]  LOCATION:  [facility]                         FACILITY:  [facility]  PHYSICIAN:  Thornton Park. [name], MD  DATE OF BIRTH:  Feb 06, 1961  DATE OF PROCEDURE: DATE OF DISCHARGE:  09/22/2014                              OPERATIVE REPORT   PREOPERATIVE INDICATIONS:  This is a 54 year old white male with a history of open bilateral inguinal hernias done 20 years ago at the [facility].  He is followed by Dr. [name].  PREOPERATIVE DIAGNOSIS:  Recurrent left inguinal hernia.  POSTOPERATIVE DIAGNOSIS:  Left incarcerated indirect inguinal hernia.  PROCEDURE:  Laparoscopic preperitoneal repair and takedown of this incarcerated left inguinal hernia.  SURGEON:  Luretha Murphy, M.D.  ASSISTANT:  None.  ANESTHESIA:  General endotracheal.  DESCRIPTION OF PROCEDURE:  The patient was taken to room #[facility] at [facility] and given general anesthesia.  After time-out and after being prepped with PCMX, I made a small transverse incision below the umbilicus and went down through a very generous adipose layer to the anterior rectus sheath on the left side.  I opened that up and with my finger, confirmed that I was within the rectus sheath and was able to move down toward the pubis in my digital dissection.  I then inserted the balloon and went down and bumped off the pubis and oriented the balloon and then insufflated it with a direct vision using a 10 mm 0 degree scope.  Balloon deployment occurred and created a space, it was somewhat more regular because of his prior open hernias and the resultant scar tissue.  I did then go in with a single 5 mm and using a Kittner mobilized more of the scar tissue although some of scar tissue laterally, I could not ever take all of that down.  Upon finding the structures, I found anterior on the left a very prominent structure going up into the abdominal wall  which I began teasing away.  This was obviously incarcerated.  Preoperatively, I had been able to partially reduce this.  It took some time in doing to go ahead and get this fully reduced.  In the process, I did enter this hernia sac and I did get some pneumoperitoneum which I was able to still work without having to put in a Veress needle.  I completed the reduction of this incarcerated hernia and then defined the anatomy little better.  I elected then to repair this with a pack Davol 3D max mesh, anchoring it down along the inguinal ligament with multiple tacks also anteriorly and medially and then letting it splay out laterally although it was going to tuck in little bit because laterally it would not completely dissect down.  In the end, I had good coverage of the defect.  I then injected Marcaine into the wounds which included two 5 mm and then the trocar balloon port.  The fascia of the rectus was closed with a 0 Vicryl and then the wounds were closed with 4-0 Vicryl and Dermabond.  The patient tolerated the procedure well and was taken to the recovery room in satisfactory condition.  Thornton ParkMatthew B. Daphine DeutscherMartin, MD     MBM/MEDQ  D:  09/22/2014  T:  09/23/2014  Job:  [id number]  cc:   Tally Joeavid Swayne, M.D. Fax: [phone]

## 2014-12-04 ENCOUNTER — Other Ambulatory Visit (INDEPENDENT_AMBULATORY_CARE_PROVIDER_SITE_OTHER): Payer: Self-pay

## 2014-12-04 DIAGNOSIS — R1032 Left lower quadrant pain: Secondary | ICD-10-CM

## 2014-12-04 DIAGNOSIS — K4091 Unilateral inguinal hernia, without obstruction or gangrene, recurrent: Secondary | ICD-10-CM

## 2014-12-10 ENCOUNTER — Ambulatory Visit
Admission: RE | Admit: 2014-12-10 | Discharge: 2014-12-10 | Disposition: A | Discharge status: Home / Self Care | Payer: Managed Care, Other (non HMO) | Source: Ambulatory Visit | Attending: Surgery | Admitting: Surgery

## 2014-12-10 MED ORDER — IOPAMIDOL (ISOVUE-300) INJECTION 61%
125.0000 mL | Freq: Once | INTRAVENOUS | Status: AC | PRN
  Administered 2014-12-10: 125 mL via INTRAVENOUS

## 2015-10-07 DIAGNOSIS — G4733 Obstructive sleep apnea (adult) (pediatric): Secondary | ICD-10-CM | POA: Diagnosis not present

## 2015-10-16 DIAGNOSIS — J029 Acute pharyngitis, unspecified: Secondary | ICD-10-CM | POA: Diagnosis not present

## 2015-10-16 DIAGNOSIS — L309 Dermatitis, unspecified: Secondary | ICD-10-CM | POA: Diagnosis not present

## 2015-11-27 DIAGNOSIS — E782 Mixed hyperlipidemia: Secondary | ICD-10-CM | POA: Diagnosis not present

## 2015-11-27 DIAGNOSIS — E1165 Type 2 diabetes mellitus with hyperglycemia: Secondary | ICD-10-CM | POA: Diagnosis not present

## 2015-11-27 DIAGNOSIS — E039 Hypothyroidism, unspecified: Secondary | ICD-10-CM | POA: Diagnosis not present

## 2015-11-27 DIAGNOSIS — N529 Male erectile dysfunction, unspecified: Secondary | ICD-10-CM | POA: Diagnosis not present

## 2015-11-27 DIAGNOSIS — E781 Pure hyperglyceridemia: Secondary | ICD-10-CM | POA: Diagnosis not present

## 2015-12-25 DIAGNOSIS — J029 Acute pharyngitis, unspecified: Secondary | ICD-10-CM | POA: Diagnosis not present

## 2015-12-25 DIAGNOSIS — M62830 Muscle spasm of back: Secondary | ICD-10-CM | POA: Diagnosis not present

## 2016-06-06 DIAGNOSIS — E782 Mixed hyperlipidemia: Secondary | ICD-10-CM | POA: Diagnosis not present

## 2016-06-06 DIAGNOSIS — E1165 Type 2 diabetes mellitus with hyperglycemia: Secondary | ICD-10-CM | POA: Diagnosis not present

## 2016-06-06 DIAGNOSIS — Z23 Encounter for immunization: Secondary | ICD-10-CM | POA: Diagnosis not present

## 2016-06-06 DIAGNOSIS — E039 Hypothyroidism, unspecified: Secondary | ICD-10-CM | POA: Diagnosis not present

## 2016-06-06 DIAGNOSIS — N529 Male erectile dysfunction, unspecified: Secondary | ICD-10-CM | POA: Diagnosis not present

## 2016-08-18 DIAGNOSIS — H903 Sensorineural hearing loss, bilateral: Secondary | ICD-10-CM | POA: Diagnosis not present

## 2016-08-18 DIAGNOSIS — H6983 Other specified disorders of Eustachian tube, bilateral: Secondary | ICD-10-CM | POA: Diagnosis not present

## 2016-08-24 DIAGNOSIS — G4733 Obstructive sleep apnea (adult) (pediatric): Secondary | ICD-10-CM | POA: Diagnosis not present

## 2016-11-21 DIAGNOSIS — S60463A Insect bite (nonvenomous) of left middle finger, initial encounter: Secondary | ICD-10-CM | POA: Diagnosis not present

## 2016-11-21 DIAGNOSIS — L03114 Cellulitis of left upper limb: Secondary | ICD-10-CM | POA: Diagnosis not present

## 2016-11-29 DIAGNOSIS — R110 Nausea: Secondary | ICD-10-CM | POA: Diagnosis not present

## 2016-11-29 DIAGNOSIS — R42 Dizziness and giddiness: Secondary | ICD-10-CM | POA: Diagnosis not present

## 2016-11-29 DIAGNOSIS — L03114 Cellulitis of left upper limb: Secondary | ICD-10-CM | POA: Diagnosis not present

## 2016-12-27 DIAGNOSIS — E781 Pure hyperglyceridemia: Secondary | ICD-10-CM | POA: Diagnosis not present

## 2016-12-27 DIAGNOSIS — M545 Low back pain: Secondary | ICD-10-CM | POA: Diagnosis not present

## 2016-12-27 DIAGNOSIS — E782 Mixed hyperlipidemia: Secondary | ICD-10-CM | POA: Diagnosis not present

## 2016-12-27 DIAGNOSIS — E1165 Type 2 diabetes mellitus with hyperglycemia: Secondary | ICD-10-CM | POA: Diagnosis not present

## 2016-12-27 DIAGNOSIS — E039 Hypothyroidism, unspecified: Secondary | ICD-10-CM | POA: Diagnosis not present

## 2017-03-02 DIAGNOSIS — L237 Allergic contact dermatitis due to plants, except food: Secondary | ICD-10-CM | POA: Diagnosis not present

## 2017-03-24 DIAGNOSIS — H6691 Otitis media, unspecified, right ear: Secondary | ICD-10-CM | POA: Diagnosis not present

## 2017-03-24 DIAGNOSIS — R05 Cough: Secondary | ICD-10-CM | POA: Diagnosis not present

## 2017-06-16 DIAGNOSIS — E782 Mixed hyperlipidemia: Secondary | ICD-10-CM | POA: Diagnosis not present

## 2017-06-16 DIAGNOSIS — E039 Hypothyroidism, unspecified: Secondary | ICD-10-CM | POA: Diagnosis not present

## 2017-06-16 DIAGNOSIS — E1165 Type 2 diabetes mellitus with hyperglycemia: Secondary | ICD-10-CM | POA: Diagnosis not present

## 2017-06-16 DIAGNOSIS — M545 Low back pain: Secondary | ICD-10-CM | POA: Diagnosis not present

## 2018-01-11 DIAGNOSIS — E1169 Type 2 diabetes mellitus with other specified complication: Secondary | ICD-10-CM | POA: Diagnosis not present

## 2018-01-11 DIAGNOSIS — K4090 Unilateral inguinal hernia, without obstruction or gangrene, not specified as recurrent: Secondary | ICD-10-CM | POA: Diagnosis not present

## 2018-01-11 DIAGNOSIS — Z0000 Encounter for general adult medical examination without abnormal findings: Secondary | ICD-10-CM | POA: Diagnosis not present

## 2018-01-11 DIAGNOSIS — H6061 Unspecified chronic otitis externa, right ear: Secondary | ICD-10-CM | POA: Diagnosis not present

## 2018-01-11 DIAGNOSIS — E782 Mixed hyperlipidemia: Secondary | ICD-10-CM | POA: Diagnosis not present

## 2018-01-11 DIAGNOSIS — Z125 Encounter for screening for malignant neoplasm of prostate: Secondary | ICD-10-CM | POA: Diagnosis not present

## 2018-01-11 DIAGNOSIS — E039 Hypothyroidism, unspecified: Secondary | ICD-10-CM | POA: Diagnosis not present

## 2018-01-11 DIAGNOSIS — R195 Other fecal abnormalities: Secondary | ICD-10-CM | POA: Diagnosis not present

## 2018-01-11 DIAGNOSIS — E559 Vitamin D deficiency, unspecified: Secondary | ICD-10-CM | POA: Diagnosis not present

## 2018-01-22 ENCOUNTER — Ambulatory Visit: Payer: Self-pay | Admitting: Surgery

## 2018-01-22 DIAGNOSIS — K429 Umbilical hernia without obstruction or gangrene: Secondary | ICD-10-CM | POA: Diagnosis not present

## 2018-01-22 DIAGNOSIS — K4091 Unilateral inguinal hernia, without obstruction or gangrene, recurrent: Secondary | ICD-10-CM

## 2018-01-22 DIAGNOSIS — Z720 Tobacco use: Secondary | ICD-10-CM | POA: Diagnosis not present

## 2018-01-22 DIAGNOSIS — Z01818 Encounter for other preprocedural examination: Secondary | ICD-10-CM | POA: Diagnosis not present

## 2018-01-22 HISTORY — DX: Unilateral inguinal hernia, without obstruction or gangrene, recurrent: K40.91

## 2018-01-22 NOTE — H&P (Signed)
Todd Mcneil Documented: 01/22/2018 1:34 PM Location: [facility] Patient #: [id number] DOB: 1961-03-11 Married / Language: [place] / Race: White Male  History of Present Illness Todd Sportsman MD; 01/22/2018 2:28 PM) The patient is a 57 year old male who presents with an inguinal hernia. Note for "Inguinal hernia": ` ` ` Patient sent for surgical consultation at the request of Todd Mcneil  Chief Complaint: Recurrent groin bulging on right side. Probable hernia. ` ` The patient is a pleasant gentleman with a history of inguinal hernias. He had open bilateral inguinal hernia repairs done in the 1990s in [place]. He then developed groin swelling and discomfort on the left side. After a year and a half, became much more bothersome. Came to our clinic. Recurrent left internal hernia diagnosis. Underwent laparoscopic repair with mesh by my senior partner, Dr. [name], and 2016. Stopped his thrombolytic several months to resolve but eventually improved. No problems there that he knows of. However about 4 months ago he started noticed swelling and intermittent bulging in the right groin. It is now out all the time. Intensely lost 30 pounds and is feeling healthier. Diabetes nearly resolved. He still smokes but has cut back. Moves his bowels every day. Walk several miles without difficulty. Trying to work on the gym more regularly. No severe groin pain with activity but given concerns about hernia recurrence, he wished to be seen. His primary care physician, Dr. [name], agreed.  (Review of systems as stated in this history (HPI) or in the review of systems. Otherwise all other 12 point ROS are negative) ` ` `   Past Surgical History Todd Mcneil, CMA; 01/22/2018 1:35 PM) Open Inguinal Hernia Surgery Bilateral. multiple Oral Surgery  Diagnostic Studies History Todd Mcneil, CMA; 01/22/2018 1:35 PM) Colonoscopy 5-10 years ago 1-5 years  ago  Medication History Todd Mcneil, CMA; 01/22/2018 1:38 PM) Crestor (5MG  Tablet, Oral) Active. Losartan Potassium (25MG  Tablet, Oral) Active. Triamcinolone Acetonide (0.1% Cream, External) Active. Tradjenta (5MG  Tablet, Oral) Active. Fish Oil (1000MG  Capsule, Oral) Active. Glucosamine-Vitamin D (1000-200MG -UNIT Tablet, Oral) Active. Sildenafil Citrate (20MG  Tablet, Oral) Active. Ofloxacin (0.3% Solution, Otic) Active. Medications Reconciled  Social History Todd Mcneil, CMA; 01/22/2018 1:35 PM) Alcohol use Moderate alcohol use, Occasional alcohol use. Caffeine use Coffee, Tea. Illicit drug use Prefer to discuss with provider. Tobacco use Current every day smoker, Current some day smoker.  Family History Todd Mcneil, CMA; 01/22/2018 1:35 PM) Cerebrovascular Accident Father. Diabetes Mellitus Mother. Heart Disease Father. Heart disease in male family member before age 34 Hypertension Mother. Kidney Disease Mother. Melanoma Father, Mother. Migraine Headache Son.  Other Problems Todd Mcneil, CMA; 01/22/2018 1:35 PM) Back Pain Diabetes Mellitus Inguinal Hernia Sleep Apnea Thyroid Disease     Review of Systems (Alisha Mcneil CMA; 01/22/2018 1:35 PM) General Not Present- Appetite Loss, Chills, Fatigue, Fever, Night Sweats, Weight Gain and Weight Loss. Skin Not Present- Change in Wart/Mole, Dryness, Hives, Jaundice, New Lesions, Non-Healing Wounds, Rash and Ulcer. HEENT Present- Earache, Hearing Loss, Ringing in the Ears and Wears glasses/contact lenses. Not Present- Hoarseness, Nose Bleed, Oral Ulcers, Seasonal Allergies, Sinus Pain, Sore Throat, Visual Disturbances and Yellow Eyes. Respiratory Not Present- Bloody sputum, Chronic Cough, Difficulty Breathing, Snoring and Wheezing. Breast Not Present- Breast Mass, Breast Pain, Nipple Discharge and Skin Changes. Cardiovascular Not Present- Chest Pain, Difficulty Breathing Lying Down, Leg  Cramps, Palpitations, Rapid Heart Rate, Shortness of Breath and Swelling of Extremities. Gastrointestinal Present- Abdominal Pain. Not Present- Bloating, Bloody Stool, Change in Bowel Habits,  Chronic diarrhea, Constipation, Difficulty Swallowing, Excessive gas, Gets full quickly at meals, Hemorrhoids, Indigestion, Nausea, Rectal Pain and Vomiting. Male Genitourinary Present- Urgency. Not Present- Blood in Urine, Change in Urinary Stream, Frequency, Impotence, Nocturia, Painful Urination and Urine Leakage. Musculoskeletal Present- Back Pain. Not Present- Joint Pain, Joint Stiffness, Muscle Pain, Muscle Weakness and Swelling of Extremities. Neurological Not Present- Decreased Memory, Fainting, Headaches, Numbness, Seizures, Tingling, Tremor, Trouble walking and Weakness. Psychiatric Not Present- Anxiety, Bipolar, Change in Sleep Pattern, Depression, Fearful and Frequent crying. Endocrine Not Present- Cold Intolerance, Excessive Hunger, Hair Changes, Heat Intolerance, Hot flashes and New Diabetes. Hematology Not Present- Blood Thinners, Easy Bruising, Excessive bleeding, Gland problems, HIV and Persistent Infections.  Vitals (Alisha Mcneil CMA; 01/22/2018 1:35 PM) 01/22/2018 1:35 PM Weight: 242 lb Height: 75.5in Body Surface Area: 2.39 m Body Mass Index: 29.85 kg/m  Pulse: 78 (Regular)  BP: 110/64 (Sitting, Left Arm, Standard)      Physical Exam Todd Mcneil(Robyn Galati C. Wava Kildow MD; 01/22/2018 2:23 PM)  General Mental Status-Alert. General Appearance-Not in acute distress, Not Sickly. Orientation-Oriented X3. Hydration-Well hydrated. Voice-Normal.  Integumentary Global Assessment Upon inspection and palpation of skin surfaces of the - Axillae: non-tender, no inflammation or ulceration, no drainage. and Distribution of scalp and body hair is normal. General Characteristics Temperature - normal warmth is noted.  Head and Neck Head-normocephalic, atraumatic with no lesions or  palpable masses. Face Global Assessment - atraumatic, no absence of expression. Neck Global Assessment - no abnormal movements, no bruit auscultated on the right, no bruit auscultated on the left, no decreased range of motion, non-tender. Trachea-midline. Thyroid Gland Characteristics - non-tender.  Eye Eyeball - Left-Extraocular movements intact, No Nystagmus. Eyeball - Right-Extraocular movements intact, No Nystagmus. Cornea - Left-No Hazy. Cornea - Right-No Hazy. Sclera/Conjunctiva - Left-No scleral icterus, No Discharge. Sclera/Conjunctiva - Right-No scleral icterus, No Discharge. Pupil - Left-Direct reaction to light normal. Pupil - Right-Direct reaction to light normal. Note: Wears glasses. Vision corrected  ENMT Ears Pinna - Left - no drainage observed, no generalized tenderness observed. Right - no drainage observed, no generalized tenderness observed. Nose and Sinuses External Inspection of the Nose - no destructive lesion observed. Inspection of the nares - Left - quiet respiration. Right - quiet respiration. Mouth and Throat Lips - Upper Lip - no fissures observed, no pallor noted. Lower Lip - no fissures observed, no pallor noted. Nasopharynx - no discharge present. Oral Cavity/Oropharynx - Tongue - no dryness observed. Oral Mucosa - no cyanosis observed. Hypopharynx - no evidence of airway distress observed.  Chest and Lung Exam Inspection Movements - Normal and Symmetrical. Accessory muscles - No use of accessory muscles in breathing. Palpation Palpation of the chest reveals - Non-tender. Auscultation Breath sounds - Normal and Clear.  Cardiovascular Auscultation Rhythm - Regular. Murmurs & Other Heart Sounds - Auscultation of the heart reveals - No Murmurs and No Systolic Clicks.  Abdomen Inspection Inspection of the abdomen reveals - No Visible peristalsis and No Abnormal pulsations. Umbilicus - No Bleeding, No Urine  drainage. Palpation/Percussion Palpation and Percussion of the abdomen reveal - Soft, Non Tender, No Rebound tenderness, No Rigidity (guarding) and No Cutaneous hyperesthesia. Note: Abdomen soft. Some redundant skin consistent with his weight loss. Mild suprapubic umbilical diastases recti. 5 mm hernia at base umbilical stalk. Sensitive. Not distended. No guarding.  Male Genitourinary Sexual Maturity Tanner 5 - Adult hair pattern and Adult penile size and shape. Note: Obvious right groin bulging consistent with a reducible hernia. Not size of racquetball. Sensitivity at  spermatic cord exiting left external ring but flat consistent with prior hernia repair. No recurrence. No major hydrocele. Genitalia otherwise within normal limits  Peripheral Vascular Upper Extremity Inspection - Left - No Cyanotic nailbeds, Not Ischemic. Right - No Cyanotic nailbeds, Not Ischemic.  Neurologic Neurologic evaluation reveals -normal attention span and ability to concentrate, able to name objects and repeat phrases. Appropriate fund of knowledge , normal sensation and normal coordination. Mental Status Affect - not angry, not paranoid. Cranial Nerves-Normal Bilaterally. Gait-Normal.  Neuropsychiatric Mental status exam performed with findings of-able to articulate well with normal speech/language, rate, volume and coherence, thought content normal with ability to perform basic computations and apply abstract reasoning and no evidence of hallucinations, delusions, obsessions or homicidal/suicidal ideation.  Musculoskeletal Global Assessment Spine, Ribs and Pelvis - no instability, subluxation or laxity. Right Upper Extremity - no instability, subluxation or laxity.  Lymphatic Head & Neck  General Head & Neck Lymphatics: Bilateral - Description - No Localized lymphadenopathy. Axillary  General Axillary Region: Bilateral - Description - No Localized lymphadenopathy. Femoral &  Inguinal  Generalized Femoral & Inguinal Lymphatics: Left - Description - No Localized lymphadenopathy. Right - Description - No Localized lymphadenopathy.    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 I think he would benefit from repair. Laparoscopic approach. No evidence of any recurrence on the left side from prior laparoscopic repair in 2016.  Did caution his operative time & risk of chronic nerve pain is higher given the recurrence. Referred shoulder pain from retained carbon dioxide. Try and use heated insufflation to be aggressive and evacuating carbon dioxide. He is interested in proceeding in the next month or so. He needs to coordinate time with work.   UMBILICAL HERNIA WITHOUT OBSTRUCTION AND WITHOUT GANGRENE (K42.9) Impression: Small enough can be primarily repaired.   PREOP - ING HERNIA - ENCOUNTER FOR PREOPERATIVE EXAMINATION FOR GENERAL SURGICAL PROCEDURE (Z01.818)  Current Plans You are being scheduled for surgery- Our schedulers will call you.  You should hear from our office's scheduling department within 5 working days about the location, date, and time of surgery. We try to make accommodations for patient's preferences in scheduling surgery, but sometimes the OR schedule or the surgeon's schedule prevents [place] from making those accommodations.  If you have not heard from our office [phone]) in 5 working days, call the office and ask for your surgeon's nurse.  If you have other questions about your diagnosis, plan, or surgery, call the office and ask for your surgeon's nurse.  Written instructions provided The anatomy & physiology of the abdominal wall and pelvic floor was discussed. The pathophysiology of hernias in the inguinal and pelvic region was discussed. Natural history risks such as progressive enlargement, pain, incarceration, and  strangulation was discussed. Contributors to complications such as smoking, obesity, diabetes, prior surgery, etc were discussed.  I feel the risks of no intervention will lead to serious problems that outweigh the operative risks; therefore, I recommended surgery to reduce and repair the hernia. I explained laparoscopic techniques with possible need for an open approach. I noted usual use of mesh to patch and/or buttress hernia repair  Risks such as bleeding, infection, abscess, need for further treatment, heart attack, death, and other risks were discussed. I noted a good likelihood this will help address the problem. Goals of post-operative recovery were discussed as well. Possibility that this will not correct all symptoms was  explained. I stressed the importance of low-impact activity, aggressive pain control, avoiding constipation, & not pushing through pain to minimize risk of post-operative chronic pain or injury. Possibility of reherniation was discussed. We will work to minimize complications.  An educational handout further explaining the pathology & treatment options was given as well. Questions were answered. The patient expresses understanding & wishes to proceed with surgery.  Pt Education - Pamphlet Given - Laparoscopic Hernia Repair: discussed with patient and provided information. Pt Education - [facility] Pain Control (Denetra Formoso) Pt Education - [facility] Hernia Post-Op HCI (Verdun Rackley): discussed with patient and provided information. Pt Education - [facility] Mesh education: discussed with patient and provided information.  TOBACCO ABUSE (Z72.0)  Current Plans Pt Education - [facility] STOP SMOKING!  Todd Sportsman, MD, FACS, MASCRS Gastrointestinal and Minimally Invasive Surgery    [address] [place], [place] [address] [phone] Main / Paging [phone] Fax

## 2018-03-01 NOTE — Patient Instructions (Addendum)
Todd Mcneil  03/01/2018   Your procedure is scheduled on: 03-07-18   Report to [facility] Main  Entrance   Report to admitting at 6:30AM    Call this number if you have problems the morning of surgery [phone]     Remember: NO SOLID FOOD AFTER MIDNIGHT THE NIGHT PRIOR TO SURGERY. NOTHING BY MOUTH EXCEPT CLEAR LIQUIDS UNTIL 3 HOURS PRIOR TO SCHEULED SURGERY. PLEASE FINISH ENSURE DRINK PER SURGEON ORDER 3 HOURS PRIOR TO SCHEDULED SURGERY TIME WHICH NEEDS TO BE COMPLETED AT ___5:30AM______.   CLEAR LIQUID DIET   Foods Allowed                                                                     Foods Excluded  Coffee and tea, regular and decaf                             liquids that you cannot  Plain Jell-O in any flavor                                             see through such as: Fruit ices (not with fruit pulp)                                     milk, soups, orange juice  Iced Popsicles                                    All solid food Carbonated beverages, regular and diet                                    Cranberry, grape and apple juices Sports drinks like Gatorade Lightly seasoned clear broth or consume(fat free) Sugar, honey syrup  Sample Menu Breakfast                                Lunch                                     Supper Cranberry juice                    Beef broth                            Chicken broth Jell-O                                     Grape juice  Apple juice Coffee or tea                        Jell-O                                      Popsicle                                                Coffee or tea                        Coffee or tea  _____________________________________________________________________       Take these medicines the morning of surgery with A SIP OF WATER: Zyrtec, Levothyroxine    DO NOT TAKE ANY DIABETIC MEDICATIONS DAY OF YOUR SURGERY      You may not  have any metal on your body including hair pins and              piercings  Do not wear jewelry, make-up, lotions, powders or perfumes, deodorant                     Men may shave face and neck.   Do not bring valuables to the hospital.  IS NOT             RESPONSIBLE   FOR VALUABLES.  Contacts, dentures or bridgework may not be worn into surgery.      Patients discharged the day of surgery will not be allowed to drive home.  Name and phone number of your driver:  Special Instructions: N/A              Please read over the following fact sheets you were given: _____________________________________________________________________             [facility] Pre[facility] Before surgery, you can play an important role.  Because skin is not sterile, your skin needs to be as free of germs as possible.  You can reduce the number of germs on your skin by washing with CHG (chlorahexidine gluconate) soap before surgery.  CHG is an antiseptic cleaner which kills germs and bonds with the skin to continue killing germs even after washing. Please DO NOT use if you have an allergy to CHG or antibacterial soaps.  If your skin becomes reddened/irritated stop using the CHG and inform your nurse when you arrive at Short Stay. Do not shave (including legs and underarms) for at least 48 hours prior to the first CHG shower.  You may shave your face/neck. Please follow these instructions carefully:  1.  Shower with CHG Soap the night before surgery and the  morning of Surgery.  2.  If you choose to wash your hair, wash your hair first as usual with your  normal  shampoo.  3.  After you shampoo, rinse your hair and body thoroughly to remove the  shampoo.                           4.  Use CHG as you would any other liquid soap.  You can apply chg directly  to the skin and wash  Gently with a scrungie or clean washcloth.  5.  Apply the CHG Soap to your body ONLY FROM THE  NECK DOWN.   Do not use on face/ open                           Wound or open sores. Avoid contact with eyes, ears mouth and genitals (private parts).                       Wash face,  Genitals (private parts) with your normal soap.             6.  Wash thoroughly, paying special attention to the area where your surgery  will be performed.  7.  Thoroughly rinse your body with warm water from the neck down.  8.  DO NOT shower/wash with your normal soap after using and rinsing off  the CHG Soap.                9.  Pat yourself dry with a clean towel.            10.  Wear clean pajamas.            11.  Place clean sheets on your bed the night of your first shower and do not  sleep with pets. Day of Surgery : Do not apply any lotions/deodorants the morning of surgery.  Please wear clean clothes to the hospital/surgery center.  FAILURE TO FOLLOW THESE INSTRUCTIONS MAY RESULT IN THE CANCELLATION OF YOUR SURGERY PATIENT SIGNATURE_________________________________  NURSE SIGNATURE__________________________________  ________________________________________________________________________

## 2018-03-02 ENCOUNTER — Encounter (HOSPITAL_COMMUNITY): Payer: Self-pay

## 2018-03-02 ENCOUNTER — Encounter (HOSPITAL_COMMUNITY)
Admission: RE | Admit: 2018-03-02 | Discharge: 2018-03-02 | Disposition: A | Discharge status: Home / Self Care | Payer: BLUE CROSS/BLUE SHIELD | Source: Ambulatory Visit | Attending: Surgery | Admitting: Surgery

## 2018-03-02 ENCOUNTER — Other Ambulatory Visit: Payer: Self-pay

## 2018-03-02 DIAGNOSIS — Z01812 Encounter for preprocedural laboratory examination: Secondary | ICD-10-CM | POA: Diagnosis not present

## 2018-03-02 DIAGNOSIS — Z01810 Encounter for preprocedural cardiovascular examination: Secondary | ICD-10-CM | POA: Insufficient documentation

## 2018-03-02 HISTORY — DX: Adverse effect of unspecified anesthetic, initial encounter: T41.45XA

## 2018-03-02 HISTORY — DX: Vitamin D deficiency, unspecified: E55.9

## 2018-03-02 HISTORY — DX: Sleep apnea, unspecified: G47.30

## 2018-03-02 HISTORY — DX: Other complications of anesthesia, initial encounter: T88.59XA

## 2018-03-02 HISTORY — DX: Unspecified sensorineural hearing loss: H90.5

## 2018-03-02 LAB — CBC
HEMATOCRIT: 45.6 % (ref 39.0–52.0)
Hemoglobin: 15.4 g/dL (ref 13.0–17.0)
MCH: 30.8 pg (ref 26.0–34.0)
MCHC: 33.8 g/dL (ref 30.0–36.0)
MCV: 91.2 fL (ref 78.0–100.0)
PLATELETS: 217 10*3/uL (ref 150–400)
RBC: 5 MIL/uL (ref 4.22–5.81)
RDW: 13.4 % (ref 11.5–15.5)
WBC: 8.8 10*3/uL (ref 4.0–10.5)

## 2018-03-02 LAB — COMPREHENSIVE METABOLIC PANEL
ALT: 39 U/L (ref 0–44)
AST: 28 U/L (ref 15–41)
Albumin: 3.8 g/dL (ref 3.5–5.0)
Alkaline Phosphatase: 50 U/L (ref 38–126)
Anion gap: 7 (ref 5–15)
BILIRUBIN TOTAL: 0.4 mg/dL (ref 0.3–1.2)
BUN: 12 mg/dL (ref 6–20)
CALCIUM: 9.3 mg/dL (ref 8.9–10.3)
CO2: 26 mmol/L (ref 22–32)
CREATININE: 0.95 mg/dL (ref 0.61–1.24)
Chloride: 105 mmol/L (ref 98–111)
GFR calc Af Amer: >60 mL/min (ref >60)
Glucose, Bld: 110 mg/dL — ABNORMAL HIGH (ref 70–99)
POTASSIUM: 4 mmol/L (ref 3.5–5.1)
Sodium: 138 mmol/L (ref 135–145)
TOTAL PROTEIN: 6.9 g/dL (ref 6.5–8.1)

## 2018-03-02 LAB — GLUCOSE, CAPILLARY: GLUCOSE-CAPILLARY: 120 mg/dL — AB (ref 70–99)

## 2018-03-02 LAB — HEMOGLOBIN A1C
Hgb A1c MFr Bld: 5.8 % — ABNORMAL HIGH (ref 4.8–5.6)
MEAN PLASMA GLUCOSE: 119.76 mg/dL

## 2018-03-06 NOTE — Pre-Procedure Instructions (Signed)
CMP and Hgb A 1 C 03/02/2018 faxed to Dr. Michaell Cowing via epic.

## 2018-03-07 ENCOUNTER — Encounter (HOSPITAL_COMMUNITY): Payer: Self-pay | Admitting: *Deleted

## 2018-03-07 ENCOUNTER — Other Ambulatory Visit: Payer: Self-pay

## 2018-03-07 ENCOUNTER — Ambulatory Visit (HOSPITAL_COMMUNITY): Payer: BLUE CROSS/BLUE SHIELD | Admitting: Anesthesiology

## 2018-03-07 ENCOUNTER — Encounter (HOSPITAL_COMMUNITY)
Admission: RE | Disposition: A | Discharge status: Home / Self Care | Payer: Self-pay | Source: Ambulatory Visit | Attending: Surgery

## 2018-03-07 ENCOUNTER — Ambulatory Visit (HOSPITAL_COMMUNITY)
Admission: RE | Admit: 2018-03-07 | Discharge: 2018-03-07 | Disposition: A | Discharge status: Home / Self Care | Payer: BLUE CROSS/BLUE SHIELD | Source: Ambulatory Visit | Attending: Surgery | Admitting: Surgery

## 2018-03-07 DIAGNOSIS — I10 Essential (primary) hypertension: Secondary | ICD-10-CM | POA: Diagnosis not present

## 2018-03-07 DIAGNOSIS — F17200 Nicotine dependence, unspecified, uncomplicated: Secondary | ICD-10-CM | POA: Insufficient documentation

## 2018-03-07 DIAGNOSIS — K429 Umbilical hernia without obstruction or gangrene: Secondary | ICD-10-CM | POA: Diagnosis not present

## 2018-03-07 DIAGNOSIS — E119 Type 2 diabetes mellitus without complications: Secondary | ICD-10-CM | POA: Insufficient documentation

## 2018-03-07 DIAGNOSIS — Z79899 Other long term (current) drug therapy: Secondary | ICD-10-CM | POA: Diagnosis not present

## 2018-03-07 DIAGNOSIS — Z7984 Long term (current) use of oral hypoglycemic drugs: Secondary | ICD-10-CM | POA: Insufficient documentation

## 2018-03-07 DIAGNOSIS — K4190 Unilateral femoral hernia, without obstruction or gangrene, not specified as recurrent: Secondary | ICD-10-CM | POA: Insufficient documentation

## 2018-03-07 DIAGNOSIS — K4091 Unilateral inguinal hernia, without obstruction or gangrene, recurrent: Secondary | ICD-10-CM | POA: Insufficient documentation

## 2018-03-07 HISTORY — DX: Unilateral inguinal hernia, without obstruction or gangrene, recurrent: K40.91

## 2018-03-07 HISTORY — PX: INSERTION OF MESH: SHX5868

## 2018-03-07 HISTORY — PX: LYSIS OF ADHESION: SHX5961

## 2018-03-07 HISTORY — PX: UMBILICAL HERNIA REPAIR: SHX196

## 2018-03-07 LAB — GLUCOSE, CAPILLARY
GLUCOSE-CAPILLARY: 146 mg/dL — AB (ref 70–99)
Glucose-Capillary: 104 mg/dL — ABNORMAL HIGH (ref 70–99)

## 2018-03-07 SURGERY — REPAIR, HERNIA, UMBILICAL, LAPAROSCOPIC
Anesthesia: General | Laterality: Right

## 2018-03-07 MED ORDER — MIDAZOLAM HCL 2 MG/2ML IJ SOLN
INTRAMUSCULAR | Status: AC
  Filled 2018-03-07: qty 2

## 2018-03-07 MED ORDER — FENTANYL CITRATE (PF) 100 MCG/2ML IJ SOLN
25.0000 ug | INTRAMUSCULAR | Status: DC | PRN
  Administered 2018-03-07 (×2): 50 ug via INTRAVENOUS

## 2018-03-07 MED ORDER — CHLORHEXIDINE GLUCONATE CLOTH 2 % EX PADS: 6.0000 | MEDICATED_PAD | Freq: Once | CUTANEOUS | Status: DC

## 2018-03-07 MED ORDER — BUPIVACAINE-EPINEPHRINE (PF) 0.25% -1:200000 IJ SOLN
INTRAMUSCULAR | Status: AC
  Filled 2018-03-07: qty 60

## 2018-03-07 MED ORDER — OXYCODONE HCL 5 MG PO TABS: 5.0000 mg | ORAL_TABLET | Freq: Four times a day (QID) | ORAL | 0 refills | Status: AC | PRN

## 2018-03-07 MED ORDER — FENTANYL CITRATE (PF) 250 MCG/5ML IJ SOLN
INTRAMUSCULAR | Status: AC
  Filled 2018-03-07: qty 5

## 2018-03-07 MED ORDER — DEXAMETHASONE SODIUM PHOSPHATE 10 MG/ML IJ SOLN
INTRAMUSCULAR | Status: AC
  Filled 2018-03-07: qty 1

## 2018-03-07 MED ORDER — SCOPOLAMINE 1 MG/3DAYS TD PT72
MEDICATED_PATCH | TRANSDERMAL | Status: AC
  Administered 2018-03-07: 1.5 mg via TRANSDERMAL
  Filled 2018-03-07: qty 1

## 2018-03-07 MED ORDER — SODIUM CHLORIDE 0.9% FLUSH: 3.0000 mL | Freq: Two times a day (BID) | INTRAVENOUS | Status: DC

## 2018-03-07 MED ORDER — OXYCODONE HCL 5 MG PO TABS
ORAL_TABLET | ORAL | Status: AC
  Filled 2018-03-07: qty 1

## 2018-03-07 MED ORDER — PROMETHAZINE HCL 25 MG/ML IJ SOLN: 6.2500 mg | INTRAMUSCULAR | Status: DC | PRN

## 2018-03-07 MED ORDER — ACETAMINOPHEN 500 MG PO TABS
1000.0000 mg | ORAL_TABLET | ORAL | Status: AC
  Administered 2018-03-07: 1000 mg via ORAL
  Filled 2018-03-07: qty 2

## 2018-03-07 MED ORDER — ONDANSETRON HCL 4 MG/2ML IJ SOLN
INTRAMUSCULAR | Status: AC
  Filled 2018-03-07: qty 2

## 2018-03-07 MED ORDER — CEFAZOLIN SODIUM-DEXTROSE 2-4 GM/100ML-% IV SOLN
2.0000 g | INTRAVENOUS | Status: AC
  Administered 2018-03-07: 2 g via INTRAVENOUS
  Filled 2018-03-07: qty 100

## 2018-03-07 MED ORDER — 0.9 % SODIUM CHLORIDE (POUR BTL) OPTIME
TOPICAL | Status: DC | PRN
  Administered 2018-03-07: 1000 mL

## 2018-03-07 MED ORDER — BUPIVACAINE-EPINEPHRINE 0.25% -1:200000 IJ SOLN
INTRAMUSCULAR | Status: DC | PRN
  Administered 2018-03-07: 50 mL

## 2018-03-07 MED ORDER — FENTANYL CITRATE (PF) 100 MCG/2ML IJ SOLN: 25.0000 ug | INTRAMUSCULAR | Status: DC | PRN

## 2018-03-07 MED ORDER — ONDANSETRON HCL 4 MG/2ML IJ SOLN
INTRAMUSCULAR | Status: DC | PRN
  Administered 2018-03-07: 4 mg via INTRAVENOUS

## 2018-03-07 MED ORDER — SCOPOLAMINE 1 MG/3DAYS TD PT72
1.0000 | MEDICATED_PATCH | TRANSDERMAL | Status: DC
  Administered 2018-03-07: 1.5 mg via TRANSDERMAL

## 2018-03-07 MED ORDER — BUPIVACAINE LIPOSOME 1.3 % IJ SUSP
20.0000 mL | Freq: Once | INTRAMUSCULAR | Status: AC
  Administered 2018-03-07: 20 mL
  Filled 2018-03-07: qty 20

## 2018-03-07 MED ORDER — DEXAMETHASONE SODIUM PHOSPHATE 10 MG/ML IJ SOLN
INTRAMUSCULAR | Status: DC | PRN
  Administered 2018-03-07: 8 mg via INTRAVENOUS

## 2018-03-07 MED ORDER — SODIUM CHLORIDE 0.9% FLUSH: 3.0000 mL | INTRAVENOUS | Status: DC | PRN

## 2018-03-07 MED ORDER — ACETAMINOPHEN 325 MG PO TABS: 650.0000 mg | ORAL_TABLET | ORAL | Status: DC | PRN

## 2018-03-07 MED ORDER — LIDOCAINE 2% (20 MG/ML) 5 ML SYRINGE
INTRAMUSCULAR | Status: DC | PRN
  Administered 2018-03-07: 100 mg via INTRAVENOUS

## 2018-03-07 MED ORDER — MIDAZOLAM HCL 5 MG/5ML IJ SOLN
INTRAMUSCULAR | Status: DC | PRN
  Administered 2018-03-07: 2 mg via INTRAVENOUS

## 2018-03-07 MED ORDER — SUGAMMADEX SODIUM 500 MG/5ML IV SOLN
INTRAVENOUS | Status: AC
  Filled 2018-03-07: qty 5

## 2018-03-07 MED ORDER — STERILE WATER FOR IRRIGATION IR SOLN
Status: DC | PRN
  Administered 2018-03-07: 1000 mL

## 2018-03-07 MED ORDER — GABAPENTIN 300 MG PO CAPS
300.0000 mg | ORAL_CAPSULE | ORAL | Status: AC
  Administered 2018-03-07: 300 mg via ORAL
  Filled 2018-03-07: qty 1

## 2018-03-07 MED ORDER — PROPOFOL 10 MG/ML IV BOLUS
INTRAVENOUS | Status: AC
  Filled 2018-03-07: qty 20

## 2018-03-07 MED ORDER — ACETAMINOPHEN 650 MG RE SUPP
650.0000 mg | RECTAL | Status: DC | PRN
  Filled 2018-03-07: qty 1

## 2018-03-07 MED ORDER — FENTANYL CITRATE (PF) 100 MCG/2ML IJ SOLN
INTRAMUSCULAR | Status: AC
  Administered 2018-03-07: 50 ug via INTRAVENOUS
  Filled 2018-03-07: qty 2

## 2018-03-07 MED ORDER — LIDOCAINE 2% (20 MG/ML) 5 ML SYRINGE
INTRAMUSCULAR | Status: DC | PRN
  Administered 2018-03-07: 1 mg/kg/h via INTRAVENOUS

## 2018-03-07 MED ORDER — ROCURONIUM BROMIDE 10 MG/ML (PF) SYRINGE
PREFILLED_SYRINGE | INTRAVENOUS | Status: DC | PRN
  Administered 2018-03-07: 50 mg via INTRAVENOUS

## 2018-03-07 MED ORDER — OXYCODONE HCL 5 MG PO TABS
5.0000 mg | ORAL_TABLET | ORAL | Status: DC | PRN
  Administered 2018-03-07: 5 mg via ORAL

## 2018-03-07 MED ORDER — LIDOCAINE 2% (20 MG/ML) 5 ML SYRINGE
INTRAMUSCULAR | Status: AC
  Filled 2018-03-07: qty 5

## 2018-03-07 MED ORDER — PROPOFOL 10 MG/ML IV BOLUS
INTRAVENOUS | Status: DC | PRN
  Administered 2018-03-07: 180 mg via INTRAVENOUS

## 2018-03-07 MED ORDER — ROCURONIUM BROMIDE 10 MG/ML (PF) SYRINGE
PREFILLED_SYRINGE | INTRAVENOUS | Status: AC
  Filled 2018-03-07: qty 10

## 2018-03-07 MED ORDER — FENTANYL CITRATE (PF) 100 MCG/2ML IJ SOLN
INTRAMUSCULAR | Status: DC | PRN
  Administered 2018-03-07: 100 ug via INTRAVENOUS
  Administered 2018-03-07 (×3): 50 ug via INTRAVENOUS

## 2018-03-07 MED ORDER — LACTATED RINGERS IV SOLN
INTRAVENOUS | Status: DC
  Administered 2018-03-07: 08:00:00 via INTRAVENOUS

## 2018-03-07 MED ORDER — SODIUM CHLORIDE 0.9 % IV SOLN: 250.0000 mL | INTRAVENOUS | Status: DC | PRN

## 2018-03-07 MED ORDER — SUGAMMADEX SODIUM 500 MG/5ML IV SOLN
INTRAVENOUS | Status: DC | PRN
  Administered 2018-03-07: 225 mg via INTRAVENOUS

## 2018-03-07 SURGICAL SUPPLY — 50 items
APPLIER CLIP 5 13 M/L LIGAMAX5 (MISCELLANEOUS)
APR CLP MED LRG 5 ANG JAW (MISCELLANEOUS)
BINDER ABDOMINAL 12 ML 46-62 (SOFTGOODS) IMPLANT
CABLE HIGH FREQUENCY MONO STRZ (ELECTRODE) ×4 IMPLANT
CHLORAPREP W/TINT 26ML (MISCELLANEOUS) ×4 IMPLANT
CLIP APPLIE 5 13 M/L LIGAMAX5 (MISCELLANEOUS) IMPLANT
CLOSURE WOUND 1/2 X4 (GAUZE/BANDAGES/DRESSINGS) ×1
COVER SURGICAL LIGHT HANDLE (MISCELLANEOUS) ×4 IMPLANT
DECANTER SPIKE VIAL GLASS SM (MISCELLANEOUS) ×7 IMPLANT
DEVICE SECURE STRAP 25 ABSORB (INSTRUMENTS) IMPLANT
DEVICE TROCAR PUNCTURE CLOSURE (ENDOMECHANICALS) ×4 IMPLANT
DRAPE WARM FLUID 44X44 (DRAPE) ×4 IMPLANT
DRSG TEGADERM 2-3/8X2-3/4 SM (GAUZE/BANDAGES/DRESSINGS) ×8 IMPLANT
DRSG TEGADERM 4X4.75 (GAUZE/BANDAGES/DRESSINGS) ×3 IMPLANT
ELECT REM PT RETURN 15FT ADLT (MISCELLANEOUS) ×4 IMPLANT
GAUZE SPONGE 2X2 8PLY STRL LF (GAUZE/BANDAGES/DRESSINGS) ×1 IMPLANT
GLOVE BIOGEL PI IND STRL 7.0 (GLOVE) IMPLANT
GLOVE BIOGEL PI IND STRL 7.5 (GLOVE) IMPLANT
GLOVE BIOGEL PI INDICATOR 7.0 (GLOVE) ×4
GLOVE BIOGEL PI INDICATOR 7.5 (GLOVE) ×2
GLOVE ECLIPSE 8.0 STRL XLNG CF (GLOVE) ×4 IMPLANT
GLOVE INDICATOR 8.0 STRL GRN (GLOVE) ×4 IMPLANT
GLOVE SURG SS PI 6.0 STRL IVOR (GLOVE) ×2 IMPLANT
GOWN STRL REUS W/TWL XL LVL3 (GOWN DISPOSABLE) ×8 IMPLANT
IRRIG SUCT STRYKERFLOW 2 WTIP (MISCELLANEOUS)
IRRIGATION SUCT STRKRFLW 2 WTP (MISCELLANEOUS) IMPLANT
KIT BASIN OR (CUSTOM PROCEDURE TRAY) ×4 IMPLANT
MARKER SKIN DUAL TIP RULER LAB (MISCELLANEOUS) ×4 IMPLANT
MESH HERNIA 6X6 BARD (Mesh General) IMPLANT
MESH HERNIA BARD 6X6 (Mesh General) ×4 IMPLANT
NDL SPNL 22GX3.5 QUINCKE BK (NEEDLE) IMPLANT
NEEDLE SPNL 22GX3.5 QUINCKE BK (NEEDLE) IMPLANT
PAD POSITIONING PINK XL (MISCELLANEOUS) ×4 IMPLANT
SCISSORS LAP 5X35 DISP (ENDOMECHANICALS) ×4 IMPLANT
SHEARS HARMONIC ACE PLUS 36CM (ENDOMECHANICALS) IMPLANT
SLEEVE ADV FIXATION 5X100MM (TROCAR) ×4 IMPLANT
SPONGE GAUZE 2X2 STER 10/PKG (GAUZE/BANDAGES/DRESSINGS) ×2
STRIP CLOSURE SKIN 1/2X4 (GAUZE/BANDAGES/DRESSINGS) ×5 IMPLANT
SUT MNCRL AB 4-0 PS2 18 (SUTURE) ×4 IMPLANT
SUT PDS AB 1 CT1 27 (SUTURE) ×8 IMPLANT
SUT PROLENE 1 CT 1 30 (SUTURE) ×14 IMPLANT
SUT VIC AB 2-0 SH 27 (SUTURE) ×8
SUT VIC AB 2-0 SH 27X BRD (SUTURE) IMPLANT
SUT VICRYL 0 UR6 27IN ABS (SUTURE) ×2 IMPLANT
TOWEL OR 17X26 10 PK STRL BLUE (TOWEL DISPOSABLE) ×4 IMPLANT
TRAY LAPAROSCOPIC (CUSTOM PROCEDURE TRAY) ×4 IMPLANT
TROCAR ADV FIXATION 11X100MM (TROCAR) IMPLANT
TROCAR ADV FIXATION 5X100MM (TROCAR) ×4 IMPLANT
TROCAR BLADELESS OPT 5 100 (ENDOMECHANICALS) ×4 IMPLANT
TUBING INSUF HEATED (TUBING) ×4 IMPLANT

## 2018-03-07 NOTE — Anesthesia Procedure Notes (Signed)

## 2018-03-07 NOTE — Transfer of Care (Signed)
Immediate Anesthesia Transfer of Care Note  Patient: Todd Mcneil  Procedure(s) Performed: LAPAROSCOPIC RIGHT INGUINAL HERNIA REPAIR, PRIMARY UMBILICAL HERNIA REPAIR, RIGHT TAP BLOCK (Right ) LYSIS OF ADHESIONS (Right ) INSERTION OF MESH (Right )  Patient Location: PACU  Anesthesia Type:General  Level of Consciousness: awake, alert , oriented and patient cooperative  Airway & Oxygen Therapy: Patient Spontanous Breathing and Patient connected to face mask oxygen  Post-op Assessment: Report given to RN and Post -op Vital signs reviewed and stable  Post vital signs: Reviewed and stable  Last Vitals:  Vitals Value Taken Time  BP    Temp    Pulse 80 03/07/2018 10:48 AM  Resp 19 03/07/2018 10:48 AM  SpO2 94 % 03/07/2018 10:48 AM  Vitals shown include unvalidated device data.  Last Pain:  Vitals:   03/07/18 0701  TempSrc: Oral      Patients Stated Pain Goal: 3 (03/07/18 0739)  Complications: No apparent anesthesia complications

## 2018-03-07 NOTE — Op Note (Addendum)
03/07/2018  10:34 AM  PATIENT:  Todd Mcneil  57 y.o. male  Patient Care Team: [name], MD as PCP - General (Family Medicine) Karie Soda, MD as Consulting Physician (General Surgery)  PRE-OPERATIVE DIAGNOSIS:  RIGHT RECURRENT INGUINAL HERNIA.  UMBILICAL HERNIA  POST-OPERATIVE DIAGNOSIS:    RIGHT RECURRENT INGUINAL HERNIA  RIGHT FEMORAL HERNIA UMBILICAL HERNIA  PROCEDURE:   LAPAROSCOPIC RIGHT INGUINAL HERNIA REPAIR LAPAROSCOPIC RIGHT FEMORAL HERNIA REPAIR PRIMARY UMBILICAL HERNIA REPAIR RIGHT TAP BLOCK LYSIS OF ADHESIONS INSERTION OF MESH  SURGEON:  [name], MD  ASSISTANT: RN   ANESTHESIA:   regional and general Ilioinguinal, genitofemoral, and spermatic cord nerve blocks  Nerve block provided with liposomal bupivacaine (Experel) mixed with 0.25% bupivacaine as a Right TAP block x 30mL at the level of the transverse abdominis & preperitoneal spaces along the flank at the anterior axillary line, from subcostal ridge to iliac crest under laparoscopic guidance    EBL:  No intake/output data recorded.  Delay start of Pharmacological VTE agent (>24hrs) due to surgical blood loss or risk of bleeding:  NO  DRAINS: none   SPECIMEN:  No Specimen  DISPOSITION OF SPECIMEN:  N/A  COUNTS:  YES  PLAN OF CARE: Discharge to home after PACU  PATIENT DISPOSITION:  PACU - hemodynamically stable.  INDICATION: Pleasant gentleman with history of inguinal hernias repaired in open fashion several decades ago.  Developed recurrence of the left side that was laparoscopic repair.  Now has a recurrence on the right side.  Possible umbilical hernia as well.  I recommended laparoscopic possible open exploration with repair of hernias found.  The anatomy & physiology of the abdominal wall and pelvic floor was discussed.  The pathophysiology of hernias in the inguinal and pelvic region was discussed.  Natural history risks such as progressive enlargement, pain, incarceration  & strangulation was discussed.   Contributors to complications such as smoking, obesity, diabetes, prior surgery, etc were discussed.    I feel the risks of no intervention will lead to serious problems that outweigh the operative risks; therefore, I recommended surgery to reduce and repair the hernia.  I explained laparoscopic techniques with possible need for an open approach.  I noted usual use of mesh to patch and/or buttress hernia repair  Risks such as bleeding, infection, abscess, need for further treatment, heart attack, death, and other risks were discussed.  I noted a good likelihood this will help address the problem.   Goals of post-operative recovery were discussed as well.  Possibility that this will not correct all symptoms was explained.  I stressed the importance of low-impact activity, aggressive pain control, avoiding constipation, & not pushing through pain to minimize risk of post-operative chronic pain or injury. Possibility of reherniation was discussed.  We will work to minimize complications.     An educational handout further explaining the pathology & treatment options was given as well.  Questions were answered.  The patient expresses understanding & wishes to proceed with surgery.  OR FINDINGS: Indirect inguinal hernia of moderate size in the right side.  Small but definite femoral hernia as well.  No definite direct space hernia.  No definite obturator hernia.  On the left side, intact inguinal hernia repairs.  Mild laxity in femoral and obturator regions but no evidence of hernia recurrence.    DESCRIPTION:   The patient was identified & brought into the operating room. The patient was positioned supine with arms tucked. SCDs were active during the entire case.  The patient underwent general anesthesia without any difficulty.  The abdomen was prepped and draped in a sterile fashion. A Surgical Timeout confirmed our plan.  I made a transverse incision through the inferior  umbilical fold.  I made a small transverse nick through the anterior rectus fascia contralateral to the inguinal hernia side and placed a 0-vicryl stitch through the fascia.  I placed a Hasson trocar into the preperitoneal plane.  Entry was clean.  We induced carbon dioxide insufflation. Camera inspection revealed no injury.  I used a 83mm angled scope to bluntly free the peritoneum off the infraumbilical anterior abdominal wall.  Was able to place a port in the left mid abdomen.  Alternate between focused sharp and blunt dissection to create enough working space preperitoneal pocket to place 97mm ports into the right & left mid-abdomen into this preperitoneal cavity.  I did do a TAP block along the anterior/mid axillary line from the costal ridge down to the iliac crest with 30 mL of Exparel and quarter percent bupivacaine with epinephrine as noted above  I used blunt & focused sharp dissection to free the peritoneum off the flank and down to the pubic rim.  I focused on the right side where the hernia was detected preoperatively.  I freed the anteriolateral bladder wall off the anteriolateral pelvic wall on that side, sparing midline attachments.    I could see the peritoneum going up into a hernia defect at the internal ring consistent with a indirect inguinal hernia on the right.  I freed the peritoneum off the spermatic vessels & vas deferens.  I freed peritoneum off the retroperitoneum along the psoas muscle.  Patient had a small but definite femoral hernia as well.  I focused dissection the suprapubic region and a little bit on the left side.  He did have a defect the peritoneum that allowed me to do diagnostic laparoscopy to confirm no evidence of left-sided inguinal hernia recurrence.  Laxity in the left femoral and obturator foramina but no true hernias there.  I chose a 15x15 cm sheet of Bard standard density polypropylene mesh to patch the hernia.  I cut a sigmoid-shaped slit within a side of the  mesh, about 6cm from one edge.  I placed the mesh into the preperitoneal space & laid it as a diamond such that the inferior point had a 6x6 cm corner flap resting in the true pelvis, covering the obturator & femoral foramina.     I allowed the bladder to return to the pubis, this helping tuck the corners of the mesh in the anteriolateral pelvis.  The medial corners overlapped each other across midline cephalad to the pubic rim.   This provided >2 inch coverage around the hernia.  However it was a larger hernia and this is already recurrence, so I did place a second sheet of mesh as a diamond along the midline.  Inferior tip over the bladder.  Lateral tails covering both direct spaces and especially extra overlap on the right side.  Because of the extra sheet of mesh covering the defects well, I did not place any tacks.  I held the hernia sac cephalad & evacuated carbon dioxide.  I closed the infraumbilical wound with 0-vicryl suture at the fascia and 4-0 monocryl stitch at the skin.  Sterile dressings were applied. The patient was extubated & arrived in the PACU in stable condition.  I had discussed postoperative care with the patient in the holding area. I discussed operative findings, updated  the patient's status, discussed probable steps to recovery, and gave postoperative recommendations to the patient's spouse.  Recommendations were made.  Questions were answered.  She expressed understanding & appreciation.  [name], M.D., F.A.C.S. Gastrointestinal and Minimally Invasive Surgery [facility] [address] [place], [place] [address] [phone] Main / Paging

## 2018-03-07 NOTE — Discharge Instructions (Signed)
HERNIA REPAIR: POST OP INSTRUCTIONS ° °###################################################################### ° °EAT °Gradually transition to a high fiber diet with a fiber supplement over the next few weeks after discharge.  Start with a pureed / full liquid diet (see below) ° °WALK °Walk an hour a day.  Control your pain to do that.   ° °CONTROL PAIN °Control pain so that you can walk, sleep, tolerate sneezing/coughing, and go up/down stairs. ° °HAVE A BOWEL MOVEMENT DAILY °Keep your bowels regular to avoid problems.  OK to try a laxative to override constipation.  OK to use an antidairrheal to slow down diarrhea.  Call if not better after 2 tries ° °CALL IF YOU HAVE PROBLEMS/CONCERNS °Call if you are still struggling despite following these instructions. °Call if you have concerns not answered by these instructions ° °###################################################################### ° ° ° °1. DIET: Follow a light bland diet the first 24 hours after arrival home, such as soup, liquids, crackers, etc.  Be sure to include lots of fluids daily.  Advance to a low fat / high fiber diet over the next few days after surgery.  Avoid fast food or heavy meals the first week as your are more likely to get nauseated.   ° °2. Take your usually prescribed home medications unless otherwise directed. ° °3. PAIN CONTROL: °a. Pain is best controlled by a usual combination of three different methods TOGETHER: °i. Ice/Heat °ii. Over the counter pain medication °iii. Prescription pain medication °b. Most patients will experience some swelling and bruising around the hernia(s) such as the bellybutton, groins, or old incisions.  Ice packs or heating pads (30-60 minutes up to 6 times a day) will help. Use ice for the first few days to help decrease swelling and bruising, then switch to heat to help relax tight/sore spots and speed recovery.  Some people prefer to use ice alone, heat alone, alternating between ice & heat.  Experiment  to what works for you.  Swelling and bruising can take several weeks to resolve.   °c. It is helpful to take an over-the-counter pain medication regularly for the first few weeks.  Choose one of the following that works best for you: °i. Naproxen (Aleve, etc)  Two 220mg tabs twice a day °ii. Ibuprofen (Advil, etc) Three 200mg tabs four times a day (every meal & bedtime) °iii. Acetaminophen (Tylenol, etc) 325-650mg four times a day (every meal & bedtime) °d. A  prescription for pain medication should be given to you upon discharge.  Take your pain medication as prescribed.  °i. If you are having problems/concerns with the prescription medicine (does not control pain, nausea, vomiting, rash, itching, etc), please call us [phone] to see if we need to switch you to a different pain medicine that will work better for you and/or control your side effect better. °ii. If you need a refill on your pain medication, please contact your pharmacy.  They will contact our office to request authorization. Prescriptions will not be filled after 5 pm or on week-ends. ° °4. Avoid getting constipated.  Between the surgery and the pain medications, it is common to experience some constipation.  Increasing fluid intake and taking a fiber supplement (such as Metamucil, Citrucel, FiberCon, MiraLax, etc) 1-2 times a day regularly will usually help prevent this problem from occurring.  A mild laxative (prune juice, Milk of Magnesia, MiraLax, etc) should be taken according to package directions if there are no bowel movements after 48 hours.   ° °5. Wash / shower every   day.  You may shower over the dressings as they are waterproof.   ° °6. Remove your waterproof bandages, skin tapes, and other bandages 5 days after surgery. You may replace a dressing/Band-Aid to cover the incision for comfort if you wish. You may leave the incisions open to air.  You may replace a dressing/Band-Aid to cover an incision for comfort if you wish.   Continue to shower over incision(s) after the dressing is off. ° °7. ACTIVITIES as tolerated:   °a. You may resume regular (light) daily activities beginning the next day--such as daily self-care, walking, climbing stairs--gradually increasing activities as tolerated.  Control your pain so that you can walk an hour a day.  If you can walk 30 minutes without difficulty, it is safe to try more intense activity such as jogging, treadmill, bicycling, low-impact aerobics, swimming, etc. °b. Save the most intensive and strenuous activity for last such as sit-ups, heavy lifting, contact sports, etc  Refrain from any heavy lifting or straining until you are off narcotics for pain control.   °c. DO NOT PUSH THROUGH PAIN.  Let pain be your guide: If it hurts to do something, don't do it.  Pain is your body warning you to avoid that activity for another week until the pain goes down. °d. You may drive when you are no longer taking prescription pain medication, you can comfortably wear a seatbelt, and you can safely maneuver your car and apply brakes. °e. You may have sexual intercourse when it is comfortable.  ° °8. FOLLOW UP in our office °a. Please call [facility] at [phone] to set up an appointment to see your surgeon in the office for a follow-up appointment approximately 2-3 weeks after your surgery. °b. Make sure that you call for this appointment the day you arrive home to insure a convenient appointment time. ° °9.  If you have disability of FMLA / Family leave forms, please bring the forms to the office for processing.  (do not give to your surgeon). ° °WHEN TO CALL US [phone]: °1. Poor pain control °2. Reactions / problems with new medications (rash/itching, nausea, etc)  °3. Fever over 101.5 F (38.5 C) °4. Inability to urinate °5. Nausea and/or vomiting °6. Worsening swelling or bruising °7. Continued bleeding from incision. °8. Increased pain, redness, or drainage from the incision ° ° The clinic staff is  available to answer your questions during regular business hours (8:30am-5pm).  Please don’t hesitate to call and ask to speak to one of our nurses for clinical concerns.  ° If you have a medical emergency, go to the nearest emergency room or call 911. ° A surgeon from [facility] is always on call at the hospitals in [place] ° °[facility] °[address], [address], [place], [place]  [address] ? ° [address] [address], [place], [place]   [address] °MAIN: [phone] ? TOLL FREE: [phone] ? FAX: [phone] °[email or web address] ° °General Anesthesia, Adult, Care After °These instructions provide you with information about caring for yourself after your procedure. Your health care provider may also give you more specific instructions. Your treatment has been planned according to current medical practices, but problems sometimes occur. Call your health care provider if you have any problems or questions after your procedure. °What can I expect after the procedure? °After the procedure, it is common to have: °· Vomiting. °· A sore throat. °· Mental slowness. ° °It is common to feel: °· Nauseous. °·   Cold or shivery. °· Sleepy. °· Tired. °· Sore or achy, even in parts of your body where you did not have surgery. ° °Follow these instructions at home: °For at least 24 hours after the procedure: °· Do not: °? Participate in activities where you could fall or become injured. °? Drive. °? Use heavy machinery. °? Drink alcohol. °? Take sleeping pills or medicines that cause drowsiness. °? Make important decisions or sign legal documents. °? Take care of children on your own. °· Rest. °Eating and drinking °· If you vomit, drink water, juice, or soup when you can drink without vomiting. °· Drink enough fluid to keep your urine clear or pale yellow. °· Make sure you have little or no nausea before eating solid foods. °· Follow the diet recommended by your health care  provider. °General instructions °· Have a responsible adult stay with you until you are awake and alert. °· Return to your normal activities as told by your health care provider. Ask your health care provider what activities are safe for you. °· Take over-the-counter and prescription medicines only as told by your health care provider. °· If you smoke, do not smoke without supervision. °· Keep all follow-up visits as told by your health care provider. This is important. °Contact a health care provider if: °· You continue to have nausea or vomiting at home, and medicines are not helpful. °· You cannot drink fluids or start eating again. °· You cannot urinate after 8-12 hours. °· You develop a skin rash. °· You have fever. °· You have increasing redness at the site of your procedure. °Get help right away if: °· You have difficulty breathing. °· You have chest pain. °· You have unexpected bleeding. °· You feel that you are having a life-threatening or urgent problem. °This information is not intended to replace advice given to you by your health care provider. Make sure you discuss any questions you have with your health care provider. °Document Released: 09/26/2000 Document Revised: 11/23/2015 Document Reviewed: 06/04/2015 °Elsevier Interactive Patient Education © 2018 [employer] ° ° °

## 2018-03-07 NOTE — Anesthesia Preprocedure Evaluation (Addendum)
Anesthesia Evaluation  Patient identified by MRN, date of birth, ID band Patient awake    Reviewed: Allergy & Precautions, NPO status , Patient's Chart, lab work & pertinent test results  History of Anesthesia Complications (+) history of anesthetic complications (prolonged sedation after oral surgery)  Airway Mallampati: II  TM Distance: >3 FB Neck ROM: Full    Dental  (+) Dental Advisory Given, Missing, Edentulous Upper,    Pulmonary sleep apnea , Current Smoker,    Pulmonary exam normal breath sounds clear to auscultation       Cardiovascular hypertension, Pt. on medications Normal cardiovascular exam Rhythm:Regular Rate:Normal     Neuro/Psych negative neurological ROS  negative psych ROS   GI/Hepatic negative GI ROS, Neg liver ROS,   Endo/Other  diabetes, Type 2, Oral Hypoglycemic AgentsHypothyroidism   Renal/GU negative Renal ROS     Musculoskeletal negative musculoskeletal ROS (+)   Abdominal   Peds  Hematology negative hematology ROS (+)   Anesthesia Other Findings Day of surgery medications reviewed with the patient.  Reproductive/Obstetrics                            Anesthesia Physical Anesthesia Plan  ASA: II  Anesthesia Plan: General   Post-op Pain Management:    Induction: Intravenous  PONV Risk Score and Plan: 2 and Ondansetron, Dexamethasone and Midazolam  Airway Management Planned: Oral ETT  Additional Equipment:   Intra-op Plan:   Post-operative Plan: Extubation in OR  Informed Consent: I have reviewed the patients History and Physical, chart, labs and discussed the procedure including the risks, benefits and alternatives for the proposed anesthesia with the patient or authorized representative who has indicated his/her understanding and acceptance.   Dental advisory given  Plan Discussed with: CRNA  Anesthesia Plan Comments:         Anesthesia  Quick Evaluation

## 2018-03-07 NOTE — Anesthesia Postprocedure Evaluation (Signed)
Anesthesia Post Note  Patient: JACHIN BOGUSZ  Procedure(s) Performed: LAPAROSCOPIC RIGHT INGUINAL HERNIA REPAIR, PRIMARY UMBILICAL HERNIA REPAIR, RIGHT TAP BLOCK (Right ) LYSIS OF ADHESIONS INSERTION OF MESH     Patient location during evaluation: PACU Anesthesia Type: General Level of consciousness: awake and alert, oriented and awake Pain management: pain level controlled Vital Signs Assessment: post-procedure vital signs reviewed and stable Respiratory status: spontaneous breathing, nonlabored ventilation and respiratory function stable Cardiovascular status: blood pressure returned to baseline and stable Postop Assessment: no apparent nausea or vomiting Anesthetic complications: no    Last Vitals:  Vitals:   03/07/18 1127 03/07/18 1151  BP: 123/81   Pulse: 63 64  Resp: 17 16  Temp: 36.5 C 36.4 C  SpO2: 97% 95%    Last Pain:  Vitals:   03/07/18 1214  TempSrc:   PainSc: 5                  Cecile Hearing

## 2018-03-07 NOTE — H&P (Signed)
Todd Mcneil DOB: [birth year]  Patient Care Team: [name], MD as PCP - General (Family Medicine) Karie Soda, MD as Consulting Physician (General Surgery)   Chief Complaint: Recurrent groin bulging on right side. Probable hernia. ` ` The patient is a pleasant gentleman with a history of inguinal hernias. He had open bilateral inguinal hernia repairs done in the 1990s in [place]. He then developed groin swelling and discomfort on the left side. After a year and a half, became much more bothersome. Came to our clinic. Recurrent left internal hernia diagnosis. Underwent laparoscopic repair with mesh by my senior partner, Dr. [name], and 2016. Stopped his thrombolytic several months to resolve but eventually improved. No problems there that he knows of.   However about 4 months ago he started noticed swelling and intermittent bulging in the right groin. It is now out all the time. Intentionally lost 30 pounds and is feeling healthier. Diabetes nearly resolved. He still smokes but has cut back. Moves his bowels every day. Walk several miles without difficulty. Trying to work on the gym more regularly. No severe groin pain with activity but given concerns about hernia recurrence, he wished to be seen. His primary care physician, Dr. [name], agreed.  No new events.  Ready for surgery  (Review of systems as stated in this history (HPI) or in the review of systems. Otherwise all other 12 point ROS are negative) ` ` `   Past Surgical History [name], CMA; 01/22/2018 1:35 PM) Open Inguinal Hernia Surgery Bilateral. multiple Oral Surgery  Diagnostic Studies History [name], CMA; 01/22/2018 1:35 PM) Colonoscopy 5-10 years ago 1-5 years ago  Medication History [name], CMA; 01/22/2018 1:38 PM) Crestor (5MG  Tablet, Oral) Active. Losartan Potassium (25MG  Tablet, Oral) Active. Triamcinolone Acetonide (0.1% Cream, External)  Active. Tradjenta (5MG  Tablet, Oral) Active. Fish Oil (1000MG  Capsule, Oral) Active. Glucosamine-Vitamin D (1000-200MG -UNIT Tablet, Oral) Active. Sildenafil Citrate (20MG  Tablet, Oral) Active. Ofloxacin (0.3% Solution, Otic) Active. Medications Reconciled  Social History [name], CMA; 01/22/2018 1:35 PM) Alcohol use Moderate alcohol use, Occasional alcohol use. Caffeine use Coffee, Tea. Illicit drug use Prefer to discuss with provider. Tobacco use Current every day smoker, Current some day smoker.  Family History [name], CMA; 01/22/2018 1:35 PM) Cerebrovascular Accident Father. Diabetes Mellitus Mother. Heart Disease Father. Heart disease in male family member before age 5 Hypertension Mother. Kidney Disease Mother. Melanoma Father, Mother. Migraine Headache Son.  Other Problems [name], CMA; 01/22/2018 1:35 PM) Back Pain Diabetes Mellitus Inguinal Hernia Sleep Apnea Thyroid Disease     Review of Systems ([name] CMA; 01/22/2018 1:35 PM) General Not Present- Appetite Loss, Chills, Fatigue, Fever, Night Sweats, Weight Gain and Weight Loss. Skin Not Present- Change in Wart/Mole, Dryness, Hives, Jaundice, New Lesions, Non-Healing Wounds, Rash and Ulcer. HEENT Present- Earache, Hearing Loss, Ringing in the Ears and Wears glasses/contact lenses. Not Present- Hoarseness, Nose Bleed, Oral Ulcers, Seasonal Allergies, Sinus Pain, Sore Throat, Visual Disturbances and Yellow Eyes. Respiratory Not Present- Bloody sputum, Chronic Cough, Difficulty Breathing, Snoring and Wheezing. Breast Not Present- Breast Mass, Breast Pain, Nipple Discharge and Skin Changes. Cardiovascular Not Present- Chest Pain, Difficulty Breathing Lying Down, Leg Cramps, Palpitations, Rapid Heart Rate, Shortness of Breath and Swelling of Extremities. Gastrointestinal Present- Abdominal Pain. Not Present- Bloating, Bloody Stool, Change in Bowel Habits,  Chronic diarrhea, Constipation, Difficulty Swallowing, Excessive gas, Gets full quickly at meals, Hemorrhoids, Indigestion, Nausea, Rectal Pain and Vomiting. Male Genitourinary Present- Urgency. Not Present- Blood in Urine, Change in Urinary  Stream, Frequency, Impotence, Nocturia, Painful Urination and Urine Leakage. Musculoskeletal Present- Back Pain. Not Present- Joint Pain, Joint Stiffness, Muscle Pain, Muscle Weakness and Swelling of Extremities. Neurological Not Present- Decreased Memory, Fainting, Headaches, Numbness, Seizures, Tingling, Tremor, Trouble walking and Weakness. Psychiatric Not Present- Anxiety, Bipolar, Change in Sleep Pattern, Depression, Fearful and Frequent crying. Endocrine Not Present- Cold Intolerance, Excessive Hunger, Hair Changes, Heat Intolerance, Hot flashes and New Diabetes. Hematology Not Present- Blood Thinners, Easy Bruising, Excessive bleeding, Gland problems, HIV and Persistent Infections.  Vitals ([name] CMA; 01/22/2018 1:35 PM) 01/22/2018 1:35 PM Weight: 242 lb Height: 75.5in Body Surface Area: 2.39 m Body Mass Index: 29.85 kg/m  Pulse: 78 (Regular)  BP: 110/64 (Sitting, Left Arm, Standard)  BP 127/84   Pulse 63   Temp 97.9 F (36.6 C) (Oral)   Resp 16   Ht 6\' 4"  (1.93 m)   Wt 108 kg   SpO2 97%   BMI 28.97 kg/m      Physical Exam [name] MD; 01/22/2018 2:23 PM)  General Mental Status-Alert. General Appearance-Not in acute distress, Not Sickly. Orientation-Oriented X3. Hydration-Well hydrated. Voice-Normal.  Integumentary Global Assessment Upon inspection and palpation of skin surfaces of the - Axillae: non-tender, no inflammation or ulceration, no drainage. and Distribution of scalp and body hair is normal. General Characteristics Temperature - normal warmth is noted.  Head and Neck Head-normocephalic, atraumatic with no lesions or palpable masses. Face Global Assessment - atraumatic,  no absence of expression. Neck Global Assessment - no abnormal movements, no bruit auscultated on the right, no bruit auscultated on the left, no decreased range of motion, non-tender. Trachea-midline. Thyroid Gland Characteristics - non-tender.  Eye Eyeball - Left-Extraocular movements intact, No Nystagmus. Eyeball - Right-Extraocular movements intact, No Nystagmus. Cornea - Left-No Hazy. Cornea - Right-No Hazy. Sclera/Conjunctiva - Left-No scleral icterus, No Discharge. Sclera/Conjunctiva - Right-No scleral icterus, No Discharge. Pupil - Left-Direct reaction to light normal. Pupil - Right-Direct reaction to light normal. Note: Wears glasses. Vision corrected  ENMT Ears Pinna - Left - no drainage observed, no generalized tenderness observed. Right - no drainage observed, no generalized tenderness observed. Nose and Sinuses External Inspection of the Nose - no destructive lesion observed. Inspection of the nares - Left - quiet respiration. Right - quiet respiration. Mouth and Throat Lips - Upper Lip - no fissures observed, no pallor noted. Lower Lip - no fissures observed, no pallor noted. Nasopharynx - no discharge present. Oral Cavity/Oropharynx - Tongue - no dryness observed. Oral Mucosa - no cyanosis observed. Hypopharynx - no evidence of airway distress observed.  Chest and Lung Exam Inspection Movements - Normal and Symmetrical. Accessory muscles - No use of accessory muscles in breathing. Palpation Palpation of the chest reveals - Non-tender. Auscultation Breath sounds - Normal and Clear.  Cardiovascular Auscultation Rhythm - Regular. Murmurs & Other Heart Sounds - Auscultation of the heart reveals - No Murmurs and No Systolic Clicks.  Abdomen Inspection Inspection of the abdomen reveals - No Visible peristalsis and No Abnormal pulsations. Umbilicus - No Bleeding, No Urine drainage. Palpation/Percussion Palpation and Percussion of the abdomen  reveal - Soft, Non Tender, No Rebound tenderness, No Rigidity (guarding) and No Cutaneous hyperesthesia. Note: Abdomen soft. Some redundant skin consistent with his weight loss. Mild suprapubic umbilical diastases recti. 5 mm hernia at base umbilical stalk. Sensitive. Not distended. No guarding.  Male Genitourinary Sexual Maturity Tanner 5 - Adult hair pattern and Adult penile size and shape. Note: Obvious right groin bulging consistent with  a reducible hernia. Not size of racquetball. Sensitivity at spermatic cord exiting left external ring but flat consistent with prior hernia repair. No recurrence. No major hydrocele. Genitalia otherwise within normal limits  Peripheral Vascular Upper Extremity Inspection - Left - No Cyanotic nailbeds, Not Ischemic. Right - No Cyanotic nailbeds, Not Ischemic.  Neurologic Neurologic evaluation reveals -normal attention span and ability to concentrate, able to name objects and repeat phrases. Appropriate fund of knowledge , normal sensation and normal coordination. Mental Status Affect - not angry, not paranoid. Cranial Nerves-Normal Bilaterally. Gait-Normal.  Neuropsychiatric Mental status exam performed with findings of-able to articulate well with normal speech/language, rate, volume and coherence, thought content normal with ability to perform basic computations and apply abstract reasoning and no evidence of hallucinations, delusions, obsessions or homicidal/suicidal ideation.  Musculoskeletal Global Assessment Spine, Ribs and Pelvis - no instability, subluxation or laxity. Right Upper Extremity - no instability, subluxation or laxity.  Lymphatic Head & Neck  General Head & Neck Lymphatics: Bilateral - Description - No Localized lymphadenopathy. Axillary  General Axillary Region: Bilateral - Description - No Localized lymphadenopathy. Femoral & Inguinal  Generalized Femoral & Inguinal Lymphatics: Left - Description - No  Localized lymphadenopathy. Right - Description - No Localized lymphadenopathy.    Assessment & Plan  RECURRENT RIGHT INGUINAL HERNIA (K40.91) Impression: Recurrent right inguinal hernia but reducible. I think he would benefit from repair. Laparoscopic approach. No evidence of any recurrence on the left side from prior laparoscopic repair in 2016.  Did caution his operative time & risk of chronic nerve pain is higher given the recurrence. Referred shoulder pain from retained carbon dioxide. Try and use heated insufflation to be aggressive and evacuating carbon dioxide. He is interested in proceeding in the next month or so. He needs to coordinate time with work.   UMBILICAL HERNIA WITHOUT OBSTRUCTION AND WITHOUT GANGRENE (K42.9) Impression: Small enough can be primarily repaired.   PREOP - ING HERNIA - ENCOUNTER FOR PREOPERATIVE EXAMINATION FOR GENERAL SURGICAL PROCEDURE (Z01.818)  Current Plans You are being scheduled for surgery- Our schedulers will call you.  You should hear from our office's scheduling department within 5 working days about the location, date, and time of surgery. We try to make accommodations for patient's preferences in scheduling surgery, but sometimes the OR schedule or the surgeon's schedule prevents [place] from making those accommodations.  If you have not heard from our office [phone]) in 5 working days, call the office and ask for your surgeon's nurse.  If you have other questions about your diagnosis, plan, or surgery, call the office and ask for your surgeon's nurse.  Written instructions provided The anatomy & physiology of the abdominal wall and pelvic floor was discussed. The pathophysiology of hernias in the inguinal and pelvic region was discussed. Natural history risks such as progressive enlargement, pain, incarceration, and strangulation was discussed. Contributors to complications such as smoking, obesity, diabetes, prior  surgery, etc were discussed.  I feel the risks of no intervention will lead to serious problems that outweigh the operative risks; therefore, I recommended surgery to reduce and repair the hernia. I explained laparoscopic techniques with possible need for an open approach. I noted usual use of mesh to patch and/or buttress hernia repair  Risks such as bleeding, infection, abscess, need for further treatment, heart attack, death, and other risks were discussed. I noted a good likelihood this will help address the problem. Goals of post-operative recovery were discussed as well. Possibility that this will not correct all  symptoms was explained. I stressed the importance of low-impact activity, aggressive pain control, avoiding constipation, & not pushing through pain to minimize risk of post-operative chronic pain or injury. Possibility of reherniation was discussed. We will work to minimize complications.  An educational handout further explaining the pathology & treatment options was given as well. Questions were answered. The patient expresses understanding & wishes to proceed with surgery.  Pt Education - Pamphlet Given - Laparoscopic Hernia Repair: discussed with patient and provided information. Pt Education - [facility] Pain Control (Patrisha Hausmann) Pt Education - [facility] Hernia Post-Op HCI (Kodi Guerrera): discussed with patient and provided information. Pt Education - [facility] Mesh education: discussed with patient and provided information.  TOBACCO ABUSE (Z72.0)  Current Plans Pt Education - [facility] STOP SMOKING! STOP SMOKING! We talked to the patient about the dangers of smoking.  We stressed that tobacco use dramatically increases the risk of peri-operative complications such as infection, tissue necrosis leaving to problems with incision/wound and organ healing, hernia, chronic pain, heart attack, stroke, DVT, pulmonary embolism, and death.  We noted there are programs in our community to help stop smoking.   Information was available.\  [name], MD, FACS, MASCRS Gastrointestinal and Minimally Invasive Surgery    [address], [address] [place], [place] [address] [phone] Main / Paging [phone] Fax

## 2018-03-08 ENCOUNTER — Encounter (HOSPITAL_COMMUNITY): Payer: Self-pay | Admitting: Surgery

## 2018-03-26 DIAGNOSIS — H33311 Horseshoe tear of retina without detachment, right eye: Secondary | ICD-10-CM | POA: Diagnosis not present

## 2018-03-26 DIAGNOSIS — H4311 Vitreous hemorrhage, right eye: Secondary | ICD-10-CM | POA: Diagnosis not present

## 2018-03-26 DIAGNOSIS — E119 Type 2 diabetes mellitus without complications: Secondary | ICD-10-CM | POA: Diagnosis not present

## 2018-03-26 DIAGNOSIS — H35412 Lattice degeneration of retina, left eye: Secondary | ICD-10-CM | POA: Diagnosis not present

## 2018-04-02 DIAGNOSIS — E119 Type 2 diabetes mellitus without complications: Secondary | ICD-10-CM | POA: Diagnosis not present

## 2018-04-02 DIAGNOSIS — H33311 Horseshoe tear of retina without detachment, right eye: Secondary | ICD-10-CM | POA: Diagnosis not present

## 2018-04-02 DIAGNOSIS — H4311 Vitreous hemorrhage, right eye: Secondary | ICD-10-CM | POA: Diagnosis not present

## 2018-04-04 DIAGNOSIS — H4311 Vitreous hemorrhage, right eye: Secondary | ICD-10-CM | POA: Diagnosis not present

## 2018-04-04 DIAGNOSIS — H33331 Multiple defects of retina without detachment, right eye: Secondary | ICD-10-CM | POA: Diagnosis not present

## 2018-04-04 DIAGNOSIS — H33311 Horseshoe tear of retina without detachment, right eye: Secondary | ICD-10-CM | POA: Diagnosis not present

## 2018-04-09 DIAGNOSIS — H60331 Swimmer's ear, right ear: Secondary | ICD-10-CM | POA: Diagnosis not present

## 2018-04-09 DIAGNOSIS — H66001 Acute suppurative otitis media without spontaneous rupture of ear drum, right ear: Secondary | ICD-10-CM | POA: Diagnosis not present

## 2018-04-09 DIAGNOSIS — H6983 Other specified disorders of Eustachian tube, bilateral: Secondary | ICD-10-CM | POA: Diagnosis not present

## 2018-04-09 DIAGNOSIS — H903 Sensorineural hearing loss, bilateral: Secondary | ICD-10-CM | POA: Diagnosis not present

## 2018-04-12 DIAGNOSIS — E119 Type 2 diabetes mellitus without complications: Secondary | ICD-10-CM | POA: Diagnosis not present

## 2018-04-12 DIAGNOSIS — H33311 Horseshoe tear of retina without detachment, right eye: Secondary | ICD-10-CM | POA: Diagnosis not present

## 2018-04-12 DIAGNOSIS — H4311 Vitreous hemorrhage, right eye: Secondary | ICD-10-CM | POA: Diagnosis not present

## 2018-04-12 DIAGNOSIS — H35412 Lattice degeneration of retina, left eye: Secondary | ICD-10-CM | POA: Diagnosis not present

## 2018-04-23 DIAGNOSIS — H60331 Swimmer's ear, right ear: Secondary | ICD-10-CM | POA: Diagnosis not present

## 2018-04-23 DIAGNOSIS — H6983 Other specified disorders of Eustachian tube, bilateral: Secondary | ICD-10-CM | POA: Diagnosis not present

## 2018-04-23 DIAGNOSIS — H66001 Acute suppurative otitis media without spontaneous rupture of ear drum, right ear: Secondary | ICD-10-CM | POA: Diagnosis not present

## 2018-04-23 DIAGNOSIS — H903 Sensorineural hearing loss, bilateral: Secondary | ICD-10-CM | POA: Diagnosis not present

## 2018-04-26 ENCOUNTER — Other Ambulatory Visit: Payer: Self-pay | Admitting: Otolaryngology

## 2018-04-26 DIAGNOSIS — J322 Chronic ethmoidal sinusitis: Secondary | ICD-10-CM

## 2018-04-26 DIAGNOSIS — H66001 Acute suppurative otitis media without spontaneous rupture of ear drum, right ear: Secondary | ICD-10-CM | POA: Diagnosis not present

## 2018-04-26 DIAGNOSIS — H903 Sensorineural hearing loss, bilateral: Secondary | ICD-10-CM | POA: Diagnosis not present

## 2018-04-26 DIAGNOSIS — H6983 Other specified disorders of Eustachian tube, bilateral: Secondary | ICD-10-CM | POA: Diagnosis not present

## 2018-04-26 DIAGNOSIS — H60331 Swimmer's ear, right ear: Secondary | ICD-10-CM | POA: Diagnosis not present

## 2018-05-04 ENCOUNTER — Ambulatory Visit
Admission: RE | Admit: 2018-05-04 | Discharge: 2018-05-04 | Disposition: A | Discharge status: Home / Self Care | Payer: BLUE CROSS/BLUE SHIELD | Source: Ambulatory Visit | Attending: Otolaryngology | Admitting: Otolaryngology

## 2018-05-04 DIAGNOSIS — J322 Chronic ethmoidal sinusitis: Secondary | ICD-10-CM

## 2018-05-04 DIAGNOSIS — J329 Chronic sinusitis, unspecified: Secondary | ICD-10-CM | POA: Diagnosis not present

## 2018-05-04 MED ORDER — IOPAMIDOL (ISOVUE-300) INJECTION 61%
75.0000 mL | Freq: Once | INTRAVENOUS | Status: AC | PRN
  Administered 2018-05-04: 75 mL via INTRAVENOUS

## 2018-05-21 DIAGNOSIS — H25811 Combined forms of age-related cataract, right eye: Secondary | ICD-10-CM | POA: Diagnosis not present

## 2018-05-21 DIAGNOSIS — H35413 Lattice degeneration of retina, bilateral: Secondary | ICD-10-CM | POA: Diagnosis not present

## 2018-05-21 DIAGNOSIS — H2512 Age-related nuclear cataract, left eye: Secondary | ICD-10-CM | POA: Diagnosis not present

## 2018-05-21 DIAGNOSIS — H35371 Puckering of macula, right eye: Secondary | ICD-10-CM | POA: Diagnosis not present

## 2018-06-04 DIAGNOSIS — H903 Sensorineural hearing loss, bilateral: Secondary | ICD-10-CM | POA: Diagnosis not present

## 2018-06-04 DIAGNOSIS — H6983 Other specified disorders of Eustachian tube, bilateral: Secondary | ICD-10-CM | POA: Diagnosis not present

## 2018-06-14 DIAGNOSIS — H2511 Age-related nuclear cataract, right eye: Secondary | ICD-10-CM | POA: Diagnosis not present

## 2018-06-21 DIAGNOSIS — H2512 Age-related nuclear cataract, left eye: Secondary | ICD-10-CM | POA: Diagnosis not present

## 2018-06-28 DIAGNOSIS — H2512 Age-related nuclear cataract, left eye: Secondary | ICD-10-CM | POA: Diagnosis not present

## 2018-07-19 DIAGNOSIS — E039 Hypothyroidism, unspecified: Secondary | ICD-10-CM | POA: Diagnosis not present

## 2018-07-19 DIAGNOSIS — E1169 Type 2 diabetes mellitus with other specified complication: Secondary | ICD-10-CM | POA: Diagnosis not present

## 2018-07-19 DIAGNOSIS — E782 Mixed hyperlipidemia: Secondary | ICD-10-CM | POA: Diagnosis not present

## 2018-07-19 DIAGNOSIS — L309 Dermatitis, unspecified: Secondary | ICD-10-CM | POA: Diagnosis not present

## 2018-08-29 DIAGNOSIS — H353112 Nonexudative age-related macular degeneration, right eye, intermediate dry stage: Secondary | ICD-10-CM | POA: Diagnosis not present

## 2018-08-29 DIAGNOSIS — H33311 Horseshoe tear of retina without detachment, right eye: Secondary | ICD-10-CM | POA: Diagnosis not present

## 2018-08-29 DIAGNOSIS — H35412 Lattice degeneration of retina, left eye: Secondary | ICD-10-CM | POA: Diagnosis not present

## 2018-08-29 DIAGNOSIS — E119 Type 2 diabetes mellitus without complications: Secondary | ICD-10-CM | POA: Diagnosis not present

## 2018-08-29 DIAGNOSIS — H4311 Vitreous hemorrhage, right eye: Secondary | ICD-10-CM | POA: Diagnosis not present

## 2018-11-09 DIAGNOSIS — E039 Hypothyroidism, unspecified: Secondary | ICD-10-CM | POA: Diagnosis not present

## 2018-11-09 DIAGNOSIS — E782 Mixed hyperlipidemia: Secondary | ICD-10-CM | POA: Diagnosis not present

## 2018-11-09 DIAGNOSIS — R0982 Postnasal drip: Secondary | ICD-10-CM | POA: Diagnosis not present

## 2019-06-03 DIAGNOSIS — J029 Acute pharyngitis, unspecified: Secondary | ICD-10-CM | POA: Diagnosis not present

## 2019-06-03 DIAGNOSIS — B349 Viral infection, unspecified: Secondary | ICD-10-CM | POA: Diagnosis not present

## 2019-06-03 DIAGNOSIS — J302 Other seasonal allergic rhinitis: Secondary | ICD-10-CM | POA: Diagnosis not present

## 2019-06-04 DIAGNOSIS — J029 Acute pharyngitis, unspecified: Secondary | ICD-10-CM | POA: Diagnosis not present

## 2019-06-04 DIAGNOSIS — B349 Viral infection, unspecified: Secondary | ICD-10-CM | POA: Diagnosis not present

## 2019-09-04 DIAGNOSIS — G4730 Sleep apnea, unspecified: Secondary | ICD-10-CM | POA: Diagnosis not present

## 2019-09-04 DIAGNOSIS — Z8669 Personal history of other diseases of the nervous system and sense organs: Secondary | ICD-10-CM | POA: Diagnosis not present

## 2019-09-04 DIAGNOSIS — H35412 Lattice degeneration of retina, left eye: Secondary | ICD-10-CM | POA: Diagnosis not present

## 2019-09-04 DIAGNOSIS — H353112 Nonexudative age-related macular degeneration, right eye, intermediate dry stage: Secondary | ICD-10-CM | POA: Diagnosis not present

## 2019-09-04 DIAGNOSIS — E119 Type 2 diabetes mellitus without complications: Secondary | ICD-10-CM | POA: Diagnosis not present

## 2019-12-27 DIAGNOSIS — E1169 Type 2 diabetes mellitus with other specified complication: Secondary | ICD-10-CM | POA: Diagnosis not present

## 2019-12-27 DIAGNOSIS — E782 Mixed hyperlipidemia: Secondary | ICD-10-CM | POA: Diagnosis not present

## 2019-12-27 DIAGNOSIS — M545 Low back pain: Secondary | ICD-10-CM | POA: Diagnosis not present

## 2019-12-27 DIAGNOSIS — E039 Hypothyroidism, unspecified: Secondary | ICD-10-CM | POA: Diagnosis not present

## 2019-12-31 DIAGNOSIS — M5441 Lumbago with sciatica, right side: Secondary | ICD-10-CM | POA: Diagnosis not present

## 2019-12-31 DIAGNOSIS — M545 Low back pain: Secondary | ICD-10-CM | POA: Diagnosis not present

## 2020-01-02 DIAGNOSIS — M6281 Muscle weakness (generalized): Secondary | ICD-10-CM | POA: Diagnosis not present

## 2020-01-02 DIAGNOSIS — M545 Low back pain: Secondary | ICD-10-CM | POA: Diagnosis not present

## 2020-01-02 DIAGNOSIS — M5416 Radiculopathy, lumbar region: Secondary | ICD-10-CM | POA: Diagnosis not present

## 2020-01-02 DIAGNOSIS — M25551 Pain in right hip: Secondary | ICD-10-CM | POA: Diagnosis not present

## 2020-01-07 DIAGNOSIS — M5416 Radiculopathy, lumbar region: Secondary | ICD-10-CM | POA: Diagnosis not present

## 2020-01-07 DIAGNOSIS — M25551 Pain in right hip: Secondary | ICD-10-CM | POA: Diagnosis not present

## 2020-01-07 DIAGNOSIS — M545 Low back pain: Secondary | ICD-10-CM | POA: Diagnosis not present

## 2020-01-07 DIAGNOSIS — M6281 Muscle weakness (generalized): Secondary | ICD-10-CM | POA: Diagnosis not present

## 2020-01-14 DIAGNOSIS — M545 Low back pain: Secondary | ICD-10-CM | POA: Diagnosis not present

## 2020-01-14 DIAGNOSIS — M25551 Pain in right hip: Secondary | ICD-10-CM | POA: Diagnosis not present

## 2020-01-14 DIAGNOSIS — M6281 Muscle weakness (generalized): Secondary | ICD-10-CM | POA: Diagnosis not present

## 2020-01-14 DIAGNOSIS — M5416 Radiculopathy, lumbar region: Secondary | ICD-10-CM | POA: Diagnosis not present

## 2020-01-15 DIAGNOSIS — E782 Mixed hyperlipidemia: Secondary | ICD-10-CM | POA: Diagnosis not present

## 2020-01-15 DIAGNOSIS — E559 Vitamin D deficiency, unspecified: Secondary | ICD-10-CM | POA: Diagnosis not present

## 2020-01-15 DIAGNOSIS — E1169 Type 2 diabetes mellitus with other specified complication: Secondary | ICD-10-CM | POA: Diagnosis not present

## 2020-01-15 DIAGNOSIS — E039 Hypothyroidism, unspecified: Secondary | ICD-10-CM | POA: Diagnosis not present

## 2020-01-16 DIAGNOSIS — M6281 Muscle weakness (generalized): Secondary | ICD-10-CM | POA: Diagnosis not present

## 2020-01-16 DIAGNOSIS — M25551 Pain in right hip: Secondary | ICD-10-CM | POA: Diagnosis not present

## 2020-01-16 DIAGNOSIS — M5416 Radiculopathy, lumbar region: Secondary | ICD-10-CM | POA: Diagnosis not present

## 2020-01-16 DIAGNOSIS — M545 Low back pain: Secondary | ICD-10-CM | POA: Diagnosis not present

## 2020-01-22 DIAGNOSIS — M25551 Pain in right hip: Secondary | ICD-10-CM | POA: Diagnosis not present

## 2020-01-22 DIAGNOSIS — M5416 Radiculopathy, lumbar region: Secondary | ICD-10-CM | POA: Diagnosis not present

## 2020-01-22 DIAGNOSIS — M545 Low back pain: Secondary | ICD-10-CM | POA: Diagnosis not present

## 2020-01-22 DIAGNOSIS — M6281 Muscle weakness (generalized): Secondary | ICD-10-CM | POA: Diagnosis not present

## 2020-01-28 DIAGNOSIS — M25551 Pain in right hip: Secondary | ICD-10-CM | POA: Diagnosis not present

## 2020-01-28 DIAGNOSIS — M6281 Muscle weakness (generalized): Secondary | ICD-10-CM | POA: Diagnosis not present

## 2020-01-28 DIAGNOSIS — M545 Low back pain: Secondary | ICD-10-CM | POA: Diagnosis not present

## 2020-01-28 DIAGNOSIS — M5416 Radiculopathy, lumbar region: Secondary | ICD-10-CM | POA: Diagnosis not present

## 2020-02-05 DIAGNOSIS — M5416 Radiculopathy, lumbar region: Secondary | ICD-10-CM | POA: Diagnosis not present

## 2020-02-05 DIAGNOSIS — M545 Low back pain: Secondary | ICD-10-CM | POA: Diagnosis not present

## 2020-02-05 DIAGNOSIS — M6281 Muscle weakness (generalized): Secondary | ICD-10-CM | POA: Diagnosis not present

## 2020-02-05 DIAGNOSIS — M25551 Pain in right hip: Secondary | ICD-10-CM | POA: Diagnosis not present

## 2020-05-18 DIAGNOSIS — E782 Mixed hyperlipidemia: Secondary | ICD-10-CM | POA: Diagnosis not present

## 2020-05-18 DIAGNOSIS — Z23 Encounter for immunization: Secondary | ICD-10-CM | POA: Diagnosis not present

## 2020-05-18 DIAGNOSIS — E039 Hypothyroidism, unspecified: Secondary | ICD-10-CM | POA: Diagnosis not present

## 2020-05-18 DIAGNOSIS — E1169 Type 2 diabetes mellitus with other specified complication: Secondary | ICD-10-CM | POA: Diagnosis not present

## 2020-05-18 DIAGNOSIS — M545 Low back pain, unspecified: Secondary | ICD-10-CM | POA: Diagnosis not present

## 2020-06-01 DIAGNOSIS — M25561 Pain in right knee: Secondary | ICD-10-CM | POA: Diagnosis not present

## 2020-06-07 DIAGNOSIS — Z1152 Encounter for screening for COVID-19: Secondary | ICD-10-CM | POA: Diagnosis not present

## 2020-06-07 DIAGNOSIS — R059 Cough, unspecified: Secondary | ICD-10-CM | POA: Diagnosis not present

## 2020-06-07 DIAGNOSIS — J209 Acute bronchitis, unspecified: Secondary | ICD-10-CM | POA: Diagnosis not present

## 2020-06-19 DIAGNOSIS — H6691 Otitis media, unspecified, right ear: Secondary | ICD-10-CM | POA: Diagnosis not present

## 2020-08-20 IMAGING — CT CT MAXILLOFACIAL W/ CM
1 series · 15 of 30 positions shown, 19 images · IV contrast (iopamidol)
Comparison: 07/01/2013 CT head.

CLINICAL DATA: 57 y/o M; history of retina repair 4 weeks ago.
Continued right-sided soreness and pain at the vertex of the head.
Recurrent right ear infections, nasal congestion, pressure.

Creatinine was obtained on site at [HOSPITAL] at [HOSPITAL].
Results: Creatinine 0.9 mg/dL.
EXAM:
CT MAXILLOFACIAL WITH CONTRAST
TECHNIQUE: Multidetector CT images of the paranasal sinuses was performed
according to the standard protocol following intravenous contrast
administration.
CONTRAST:  75mL 5V1VP4-6II IOPAMIDOL (5V1VP4-6II) INJECTION 61%

[Series 3: maxofacial-bone w/cm · axial · 0.44mm/px · z∈[-217,-87]mm · 15 of 71 slices shown, 19 images]
[im 3/71  brain]
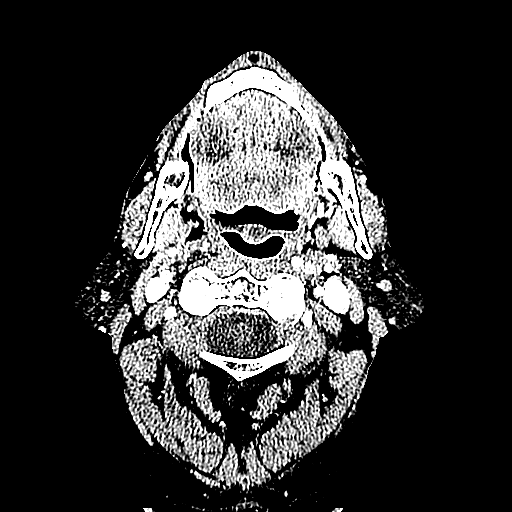
[im 3/71  bone]
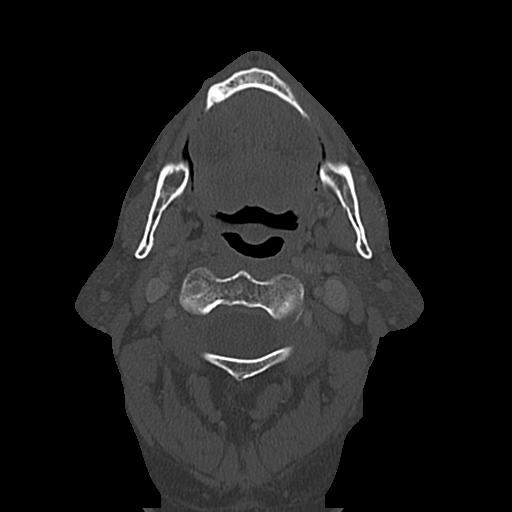
[im 8/71  bone]
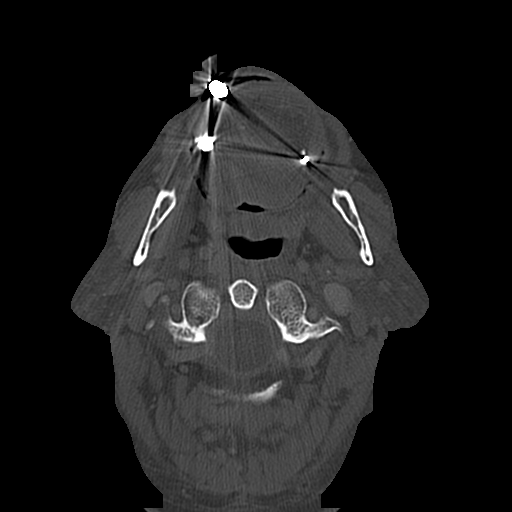
[im 13/71  bone]
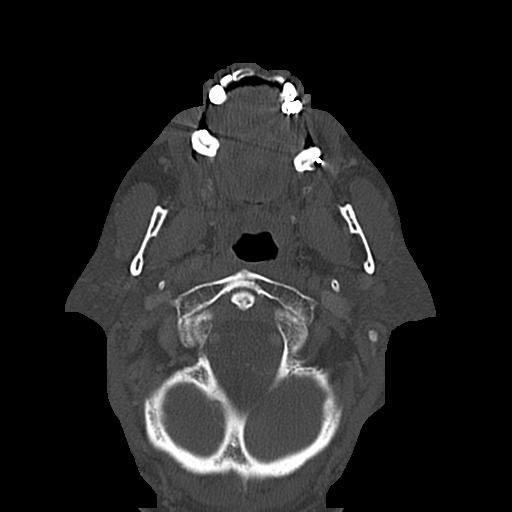
[im 17/71  bone]
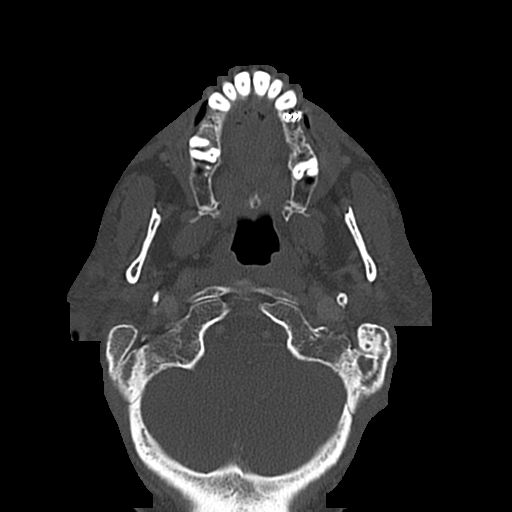
[im 22/71  brain]
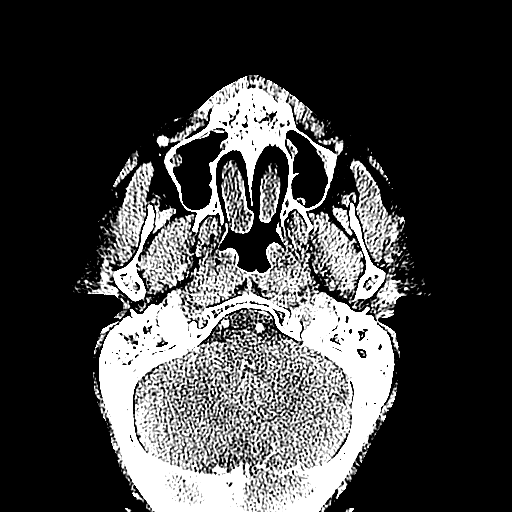
[im 22/71  bone]
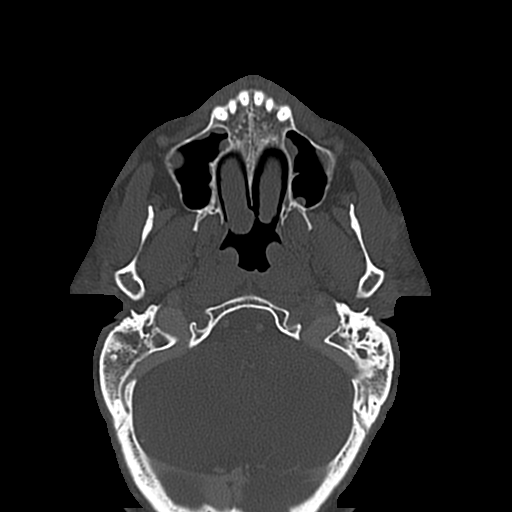
[im 27/71  bone]
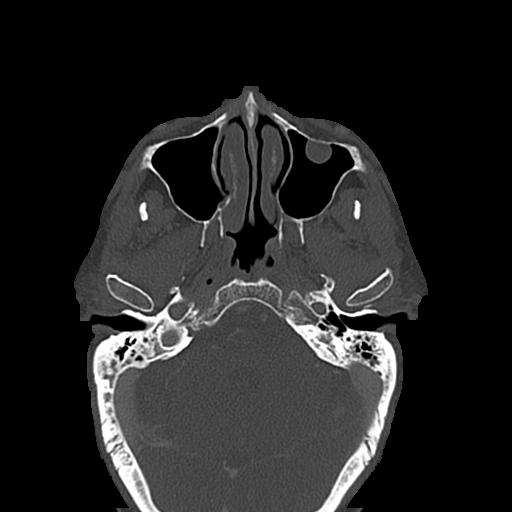
[im 32/71  bone]
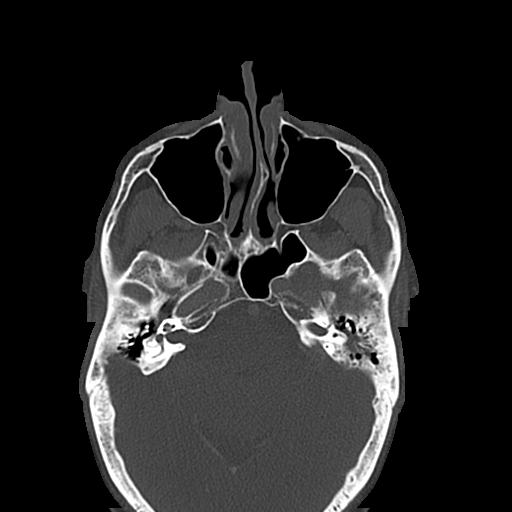
[im 37/71  bone]
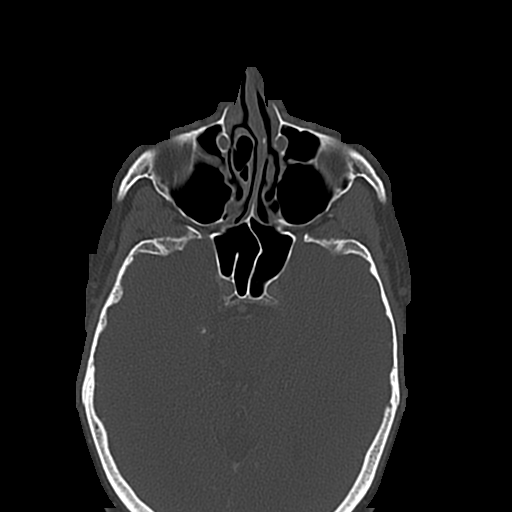
[im 39/71  brain]
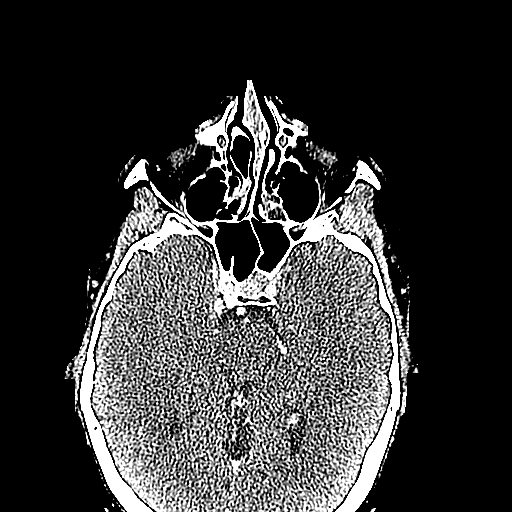
[im 39/71  bone]
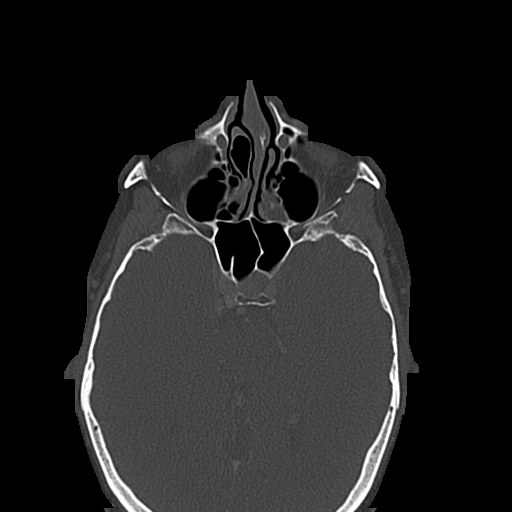
[im 44/71  bone]
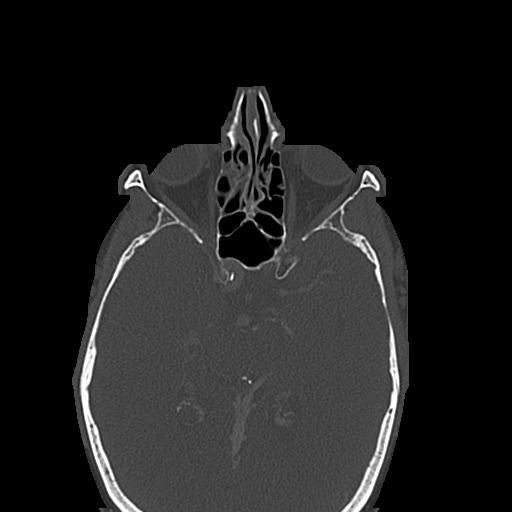
[im 49/71  bone]
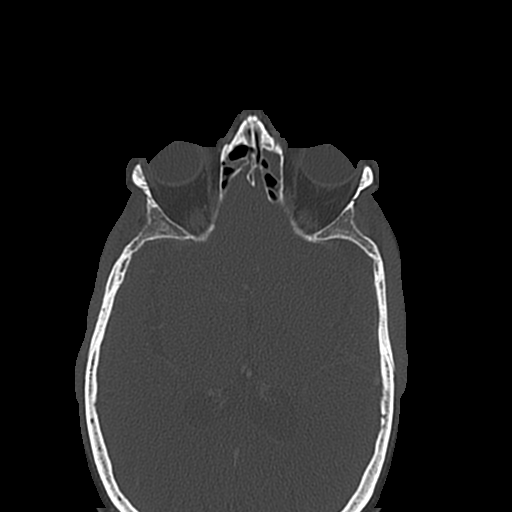
[im 54/71  bone]
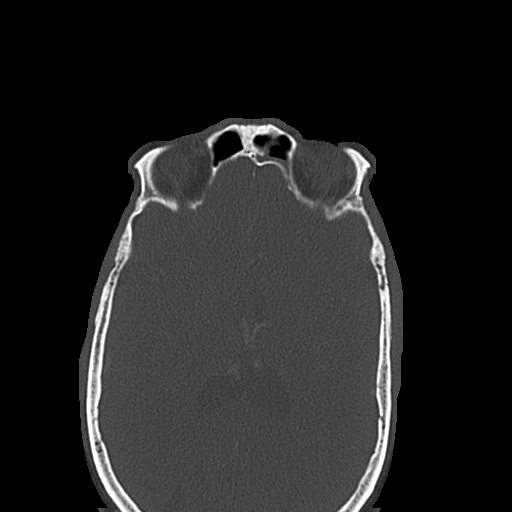
[im 58/71  brain]
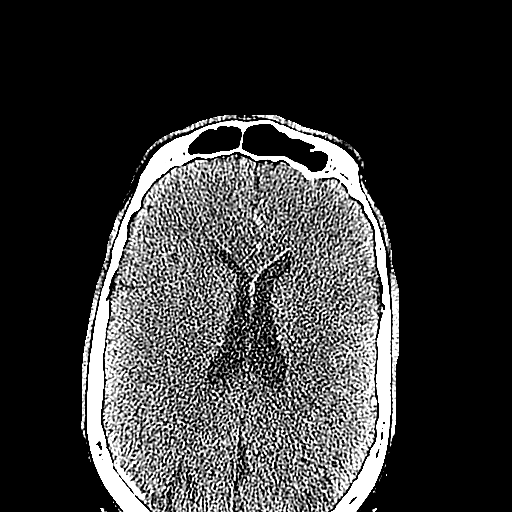
[im 58/71  bone]
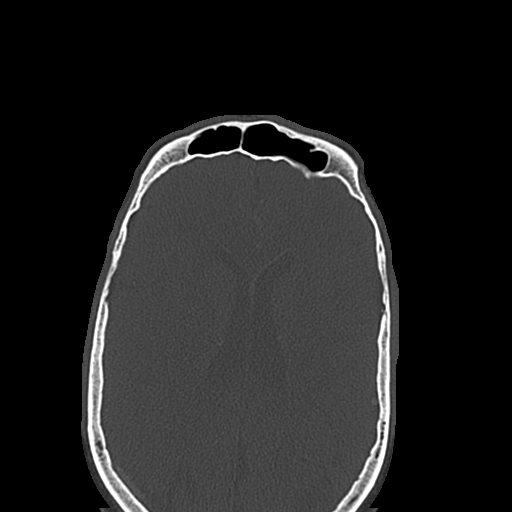
[im 63/71  bone]
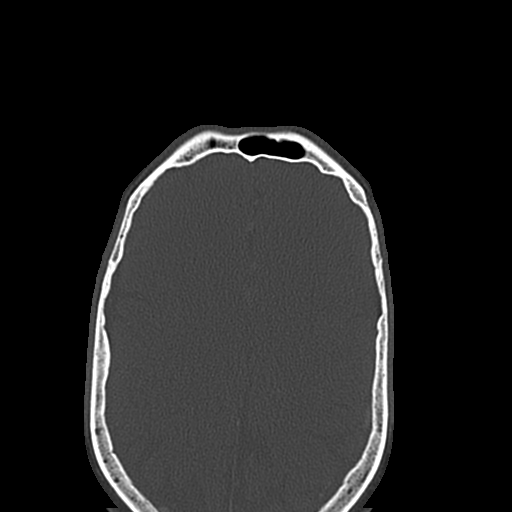
[im 68/71  bone]
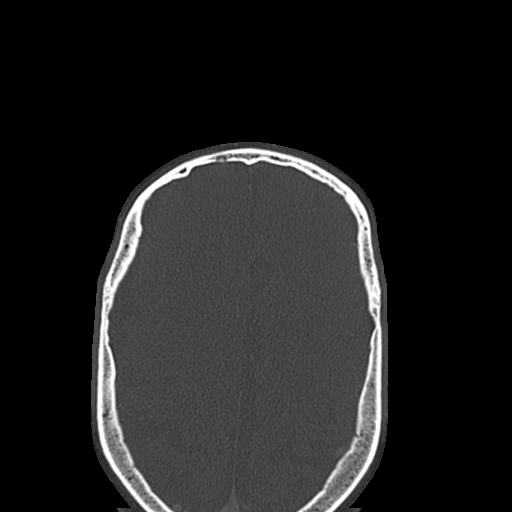

[15 of 30 positions shown; findings below may reference images not displayed]

FINDINGS: Paranasal sinuses:

Frontal: Mucosal thickening of the frontal sinus antrum. And
opacification of the left frontal sinus drainage pathway.

Ethmoid: Partial opacification of anterior ethmoid air cells and
mild mucosal thickening of posterior ethmoid air cells.

Maxillary: Small bilateral maxillary sinus mucous retention cyst.

Sphenoid: Normally aerated. Patent sphenoethmoidal recesses.
Pneumatization of left lateral recess. Bony septum inserts on the
right carotid canal.

Right ostiomeatal unit: Patent with mucosal thickening of the
infundibulum.

Left ostiomeatal unit: Patent.

Nasal passages: Leftward nasal septum deviation. Right concha
bullosa. Paradoxical right middle turbinate.

Anatomy: No pneumatization superior to anterior ethmoid notches.
Right olfactory groove is 2 mm lower than the left olfactory
grooves, asymmetric fovea ethmoidalis, right Keros I and left Keros
III. Sellar sphenoid pneumatization pattern.

Other: Normal aeration of the right mastoid air cells and middle ear
cavity. Partial opacification of the left mastoid air cells. No mass
or inflammatory process of the orbital compartments. Visible
superficial facial soft tissues are unremarkable. No fluid
collection or abnormal enhancement of soft tissues.
IMPRESSION: 1. Paranasal sinus disease greatest in the anterior ethmoid air
cells in the left frontal sinus. No sinus fluid levels.
2. Opacified left frontal sinus drainage pathway. Additional sinus
drainage pathways are patent.
3. Partial opacification of left mastoid air cells.
4. No mass or inflammatory process of facial soft tissues or the
orbital compartments identified.

By: Alexsis Noa M.D.

## 2020-09-03 ENCOUNTER — Encounter (INDEPENDENT_AMBULATORY_CARE_PROVIDER_SITE_OTHER): Payer: BC Managed Care – PPO | Admitting: Ophthalmology

## 2020-10-07 ENCOUNTER — Ambulatory Visit (INDEPENDENT_AMBULATORY_CARE_PROVIDER_SITE_OTHER): Payer: BC Managed Care – PPO | Admitting: Ophthalmology

## 2020-10-07 ENCOUNTER — Encounter (INDEPENDENT_AMBULATORY_CARE_PROVIDER_SITE_OTHER): Payer: Self-pay | Admitting: Ophthalmology

## 2020-10-07 ENCOUNTER — Other Ambulatory Visit: Payer: Self-pay

## 2020-10-07 DIAGNOSIS — G4733 Obstructive sleep apnea (adult) (pediatric): Secondary | ICD-10-CM

## 2020-10-07 DIAGNOSIS — H43812 Vitreous degeneration, left eye: Secondary | ICD-10-CM | POA: Diagnosis not present

## 2020-10-07 DIAGNOSIS — H35412 Lattice degeneration of retina, left eye: Secondary | ICD-10-CM

## 2020-10-07 DIAGNOSIS — E119 Type 2 diabetes mellitus without complications: Secondary | ICD-10-CM

## 2020-10-07 DIAGNOSIS — Z8669 Personal history of other diseases of the nervous system and sense organs: Secondary | ICD-10-CM | POA: Diagnosis not present

## 2020-10-07 DIAGNOSIS — Z9989 Dependence on other enabling machines and devices: Secondary | ICD-10-CM

## 2020-10-07 NOTE — Assessment & Plan Note (Signed)
No new retinal holes or tears 

## 2020-10-07 NOTE — Assessment & Plan Note (Signed)
I do recommend use of CPAP consistently to prevent nightly hypoxic damage to the macula

## 2020-10-07 NOTE — Assessment & Plan Note (Signed)
No signs of diabetic retinopathy 

## 2020-10-07 NOTE — Progress Notes (Signed)
10/07/2020     CHIEF COMPLAINT Patient presents for Retina Follow Up (1 Year F/U OU//Pt reports continued floaters off and on OU, OD>OS. Pt denies new symptoms OU. [facility] stable OU per pt.)   HISTORY OF PRESENT ILLNESS: Todd Mcneil is a 60 y.o. male who presents to the clinic today for:   HPI    Retina Follow Up    Patient presents with  Other.  In both eyes.  This started 1 year ago.  Severity is mild.  Duration of 1 year.  Since onset it is stable. Additional comments: 1 Year F/U OU  Pt reports continued floaters off and on OU, OD>OS. Pt denies new symptoms OU. [facility] stable OU per pt.       Last edited by Ileana Roup, COA on 10/07/2020  8:19 AM. (History)      Referring physician: [name], MD [phone] [address] [place],  [place] [address]  HISTORICAL INFORMATION:   Selected notes from the MEDICAL RECORD NUMBER    Lab Results  Component Value Date   HGBA1C 5.8 (H) 03/02/2018     CURRENT MEDICATIONS: No current outpatient medications on file. (Ophthalmic Drugs)   No current facility-administered medications for this visit. (Ophthalmic Drugs)   Current Outpatient Medications (Other)  Medication Sig  . acetaminophen (TYLENOL) 500 MG tablet Take 1,000 mg by mouth every 6 (six) hours as needed for mild pain.  . cetirizine (ZYRTEC) 10 MG tablet Take 10 mg by mouth as needed for allergies.   . Cholecalciferol (VITAMIN D) 2000 UNITS tablet Take 4,000 Units by mouth once a week.   . cyclobenzaprine (FLEXERIL) 10 MG tablet Take 5-10 mg by mouth every 8 (eight) hours as needed for muscle spasms.  Marland Kitchen glucosamine-chondroitin 500-400 MG tablet Take 1 tablet by mouth once a week.   . levothyroxine (SYNTHROID, LEVOTHROID) 100 MCG tablet Take 100 mcg by mouth daily before breakfast.  . linagliptin (TRADJENTA) 5 MG TABS tablet Take 5 mg by mouth daily.  Marland Kitchen losartan (COZAAR) 25 MG tablet Take 25 mg by mouth daily.  . metFORMIN (GLUCOPHAGE) 1000 MG tablet Take 500 mg by  mouth 2 (two) times daily with a meal.  . naproxen sodium (ANAPROX) 220 MG tablet Take 440 mg by mouth every 8 (eight) hours as needed (for pain).  Marland Kitchen ofloxacin (FLOXIN) 0.3 % OTIC solution Place 10 drops into the right ear daily.  Marland Kitchen oxyCODONE (OXY IR/ROXICODONE) 5 MG immediate release tablet Take 1-2 tablets (5-10 mg total) by mouth every 6 (six) hours as needed for moderate pain, severe pain or breakthrough pain.  . rosuvastatin (CRESTOR) 5 MG tablet Take 5 mg by mouth every evening.  . tadalafil (CIALIS) 5 MG tablet Take 5 mg by mouth daily as needed for erectile dysfunction.   No current facility-administered medications for this visit. (Other)      REVIEW OF SYSTEMS:    ALLERGIES No Known Allergies  PAST MEDICAL HISTORY Past Medical History:  Diagnosis Date  . Complication of anesthesia    prolonged sedation after oral surgery  . Diabetes mellitus without complication (HCC)   . Fatty liver disease, nonalcoholic   . Hypothyroidism   . Recurrent right inguinal hernia 01/22/2018  . Right-sided sensorineural hearing loss   . Sleep apnea    no longer uses CPAP  . Vitamin D deficiency    Past Surgical History:  Procedure Laterality Date  . COLONOSCOPY    . ear drum reconstruction surgery    .  HERNIA REPAIR  12/1993   bilateral inguinal  . INGUINAL HERNIA REPAIR Left 09/22/2014   Procedure: LAPAROSCOPIC POSSIBLE OPEN LEFT INGUINAL HERNIA;  Surgeon: Luretha Murphy, MD;  Location: [facility];  Service: General;  Laterality: Left;  With MESH  . INSERTION OF MESH  03/07/2018   Procedure: INSERTION OF MESH;  Surgeon: Karie Soda, MD;  Location: [facility];  Service: General;;  . LYSIS OF ADHESION  03/07/2018   Procedure: LYSIS OF ADHESIONS;  Surgeon: Karie Soda, MD;  Location: [facility];  Service: General;;  . MOUTH SURGERY    . MYRINGOTOMY WITH TUBE PLACEMENT  2014   bilateral - tubes in at present  . UMBILICAL HERNIA REPAIR Right 03/07/2018   Procedure: LAPAROSCOPIC RIGHT INGUINAL HERNIA  REPAIR, PRIMARY UMBILICAL HERNIA REPAIR, RIGHT TAP BLOCK;  Surgeon: Karie Soda, MD;  Location: [facility];  Service: General;  Laterality: Right;    FAMILY HISTORY History reviewed. No pertinent family history.  SOCIAL HISTORY Social History   Tobacco Use  . Smoking status: Current Every Day Smoker    Packs/day: 0.50    Years: 38.00    Pack years: 19.00    Types: Cigarettes  . Smokeless tobacco: Never Used  Vaping Use  . Vaping Use: Never used  Substance Use Topics  . Alcohol use: Not Currently    Comment: occasional  . Drug use: Yes    Types: Marijuana    Comment: occ         OPHTHALMIC EXAM: Base Eye Exam    Visual Acuity (ETDRS)      Right Left   Dist [place] 20/50 20/20   Dist ph [place] 20/30 +2        Tonometry (Tonopen, 8:19 AM)      Right Left   Pressure 15 12       Pupils      Pupils Dark Light Shape React APD   Right PERRL 5 4 Round Brisk None   Left PERRL 5 4 Round Brisk None       Visual Fields (Counting fingers)      Left Right    Full Full       Extraocular Movement      Right Left    Full Full       Neuro/Psych    Oriented x3: Yes   Mood/Affect: Normal       Dilation    Both eyes: 1.0% Mydriacyl, 2.5% Phenylephrine @ 8:23 AM          IMAGING AND PROCEDURES  Imaging and Procedures for 10/07/20  OCT, Retina - OU - Both Eyes       Right Eye Quality was good. Scan locations included subfoveal. Central Foveal Thickness: 348. Progression has been stable. Findings include normal foveal contour.   Left Eye Quality was good. Central Foveal Thickness: 326. Progression has been stable. Findings include normal foveal contour.                 ASSESSMENT/PLAN:  Left retinal lattice degeneration No new retinal holes or tears  Diabetes mellitus without complication (HCC) No signs of diabetic retinopathy  OSA on CPAP I do recommend use of CPAP consistently to prevent nightly hypoxic damage to the macula      ICD-10-CM   1.  Posterior vitreous detachment, left eye  H43.812 OCT, Retina - OU - Both Eyes  2. Left retinal lattice degeneration  H35.412 OCT, Retina - OU - Both Eyes  3. History of retinal detachment  Z86.69   4. Diabetes mellitus without complication (HCC)  E11.9   5. OSA on CPAP  G47.33    Z99.89     1.  OD with a history of retinal detachment, stable no new findings  2.  OS with lattice degeneration no new retinal breaks or tears  3.  OU, no active maculopathy although the patient does have obstructive sleep apnea and is partially compliant with CPAP use roughly 3 out of 7-10 nights on average.  I did encourage the patient to use his CPAP nightly to allow his brain to properly recharge and prevent nightly damage of hypoxic events to the central part of the vision.  I reviewed with him a number of cases via OCT where proper treatment of sleep apnea prevented and in fact reversed some evidence of certain maculopathy's  Ophthalmic Meds Ordered this visit:  No orders of the defined types were placed in this encounter.      Return in about 1 year (around 10/07/2021) for DILATE OU, OCT.  There are no Patient Instructions on file for this visit.   Explained the diagnoses, plan, and follow up with the patient and they expressed understanding.  Patient expressed understanding of the importance of proper follow up care.   Alford Highland Sonnet Rizor M.D. Diseases & Surgery of the Retina and Vitreous [facility] 10/07/20     Abbreviations: M myopia (nearsighted); A astigmatism; H hyperopia (farsighted); P presbyopia; Mrx spectacle prescription;  CTL contact lenses; OD right eye; OS left eye; OU both eyes  XT exotropia; ET esotropia; PEK punctate epithelial keratitis; PEE punctate epithelial erosions; DES dry eye syndrome; MGD meibomian gland dysfunction; ATs artificial tears; PFAT's preservative free artificial tears; NSC nuclear sclerotic cataract; PSC posterior subcapsular cataract; ERM  epi-retinal membrane; PVD posterior vitreous detachment; RD retinal detachment; DM diabetes mellitus; DR diabetic retinopathy; NPDR non-proliferative diabetic retinopathy; PDR proliferative diabetic retinopathy; CSME clinically significant macular edema; DME diabetic macular edema; dbh dot blot hemorrhages; CWS cotton wool spot; POAG primary open angle glaucoma; C/D cup-to-disc ratio; HVF humphrey visual field; GVF goldmann visual field; OCT optical coherence tomography; IOP intraocular pressure; BRVO Branch retinal vein occlusion; CRVO central retinal vein occlusion; CRAO central retinal artery occlusion; BRAO branch retinal artery occlusion; RT retinal tear; SB scleral buckle; PPV pars plana vitrectomy; VH Vitreous hemorrhage; PRP panretinal laser photocoagulation; IVK intravitreal kenalog; VMT vitreomacular traction; MH Macular hole;  NVD neovascularization of the disc; NVE neovascularization elsewhere; AREDS age related eye disease study; ARMD age related macular degeneration; POAG primary open angle glaucoma; EBMD epithelial/anterior basement membrane dystrophy; ACIOL anterior chamber intraocular lens; IOL intraocular lens; PCIOL posterior chamber intraocular lens; Phaco/IOL phacoemulsification with intraocular lens placement; PRK photorefractive keratectomy; LASIK laser assisted in situ keratomileusis; HTN hypertension; DM diabetes mellitus; COPD chronic obstructive pulmonary disease

## 2020-11-17 DIAGNOSIS — E782 Mixed hyperlipidemia: Secondary | ICD-10-CM | POA: Diagnosis not present

## 2020-11-17 DIAGNOSIS — M25561 Pain in right knee: Secondary | ICD-10-CM | POA: Diagnosis not present

## 2020-11-17 DIAGNOSIS — E559 Vitamin D deficiency, unspecified: Secondary | ICD-10-CM | POA: Diagnosis not present

## 2020-11-17 DIAGNOSIS — E039 Hypothyroidism, unspecified: Secondary | ICD-10-CM | POA: Diagnosis not present

## 2020-11-17 DIAGNOSIS — E1169 Type 2 diabetes mellitus with other specified complication: Secondary | ICD-10-CM | POA: Diagnosis not present

## 2020-12-08 DIAGNOSIS — T148XXA Other injury of unspecified body region, initial encounter: Secondary | ICD-10-CM | POA: Diagnosis not present

## 2020-12-08 DIAGNOSIS — L03119 Cellulitis of unspecified part of limb: Secondary | ICD-10-CM | POA: Diagnosis not present

## 2020-12-11 DIAGNOSIS — L03116 Cellulitis of left lower limb: Secondary | ICD-10-CM | POA: Diagnosis not present

## 2021-03-23 DIAGNOSIS — J0100 Acute maxillary sinusitis, unspecified: Secondary | ICD-10-CM | POA: Diagnosis not present

## 2021-03-23 DIAGNOSIS — H60502 Unspecified acute noninfective otitis externa, left ear: Secondary | ICD-10-CM | POA: Diagnosis not present

## 2021-05-24 DIAGNOSIS — H26491 Other secondary cataract, right eye: Secondary | ICD-10-CM | POA: Diagnosis not present

## 2021-05-24 DIAGNOSIS — Z961 Presence of intraocular lens: Secondary | ICD-10-CM | POA: Diagnosis not present

## 2021-06-01 DIAGNOSIS — E1169 Type 2 diabetes mellitus with other specified complication: Secondary | ICD-10-CM | POA: Diagnosis not present

## 2021-06-01 DIAGNOSIS — E559 Vitamin D deficiency, unspecified: Secondary | ICD-10-CM | POA: Diagnosis not present

## 2021-06-01 DIAGNOSIS — E782 Mixed hyperlipidemia: Secondary | ICD-10-CM | POA: Diagnosis not present

## 2021-06-03 DIAGNOSIS — Z0000 Encounter for general adult medical examination without abnormal findings: Secondary | ICD-10-CM | POA: Diagnosis not present

## 2021-06-03 DIAGNOSIS — E039 Hypothyroidism, unspecified: Secondary | ICD-10-CM | POA: Diagnosis not present

## 2021-06-03 DIAGNOSIS — E1169 Type 2 diabetes mellitus with other specified complication: Secondary | ICD-10-CM | POA: Diagnosis not present

## 2021-06-03 DIAGNOSIS — H60501 Unspecified acute noninfective otitis externa, right ear: Secondary | ICD-10-CM | POA: Diagnosis not present

## 2021-06-03 DIAGNOSIS — E782 Mixed hyperlipidemia: Secondary | ICD-10-CM | POA: Diagnosis not present

## 2021-07-21 DIAGNOSIS — E039 Hypothyroidism, unspecified: Secondary | ICD-10-CM | POA: Diagnosis not present

## 2021-07-24 DIAGNOSIS — J029 Acute pharyngitis, unspecified: Secondary | ICD-10-CM | POA: Diagnosis not present

## 2021-07-24 DIAGNOSIS — Z20822 Contact with and (suspected) exposure to covid-19: Secondary | ICD-10-CM | POA: Diagnosis not present

## 2021-10-07 ENCOUNTER — Ambulatory Visit (INDEPENDENT_AMBULATORY_CARE_PROVIDER_SITE_OTHER): Payer: BC Managed Care – PPO | Admitting: Ophthalmology

## 2021-10-07 ENCOUNTER — Encounter (INDEPENDENT_AMBULATORY_CARE_PROVIDER_SITE_OTHER): Payer: Self-pay | Admitting: Ophthalmology

## 2021-10-07 DIAGNOSIS — H43812 Vitreous degeneration, left eye: Secondary | ICD-10-CM | POA: Diagnosis not present

## 2021-10-07 DIAGNOSIS — Z8669 Personal history of other diseases of the nervous system and sense organs: Secondary | ICD-10-CM

## 2021-10-07 DIAGNOSIS — E119 Type 2 diabetes mellitus without complications: Secondary | ICD-10-CM

## 2021-10-07 DIAGNOSIS — H35412 Lattice degeneration of retina, left eye: Secondary | ICD-10-CM | POA: Diagnosis not present

## 2021-10-07 DIAGNOSIS — Z961 Presence of intraocular lens: Secondary | ICD-10-CM

## 2021-10-07 NOTE — Assessment & Plan Note (Signed)
OS, no new retinal breaks outside of prior pigmentary change or photocoagulation scar ?

## 2021-10-07 NOTE — Assessment & Plan Note (Signed)
Repair via vitrectomy endolaser 2019 doing well OD ?

## 2021-10-07 NOTE — Assessment & Plan Note (Addendum)
The nature of posterior vitreous detachment was discussed with the patient as well as its physiology, its age prevalence, and its possible implication regarding retinal breaks and detachment.  An informational brochure was offered to the patient.  All the patient's questions were answered.  The patient was asked to return if new or different flashes or floaters develops.   Patient was instructed to contact office immediately if any new changes were noticed. ?I explained to the patient that vitreous inside the eye is similar to jello inside a bowl. As the jello melts it can start to pull away from the bowl, similarly the vitreous throughout our lives can begin to pull away from the retina. That process is called a posterior vitreous detachment. In some cases, the vitreous can tug hard enough on the retina to form a retinal tear. I discussed with the patient the signs and symptoms of a retinal detachment.  Do not rub the eye. ?No new breaks ? ?

## 2021-10-07 NOTE — Assessment & Plan Note (Signed)
No detectable diabetic retinopathy 

## 2021-10-07 NOTE — Assessment & Plan Note (Signed)
OU look great, open PC ?

## 2021-10-07 NOTE — Progress Notes (Signed)
? ? ?10/07/2021 ? ?  ? ?CHIEF COMPLAINT ?Patient presents for  ?Chief Complaint  ?Patient presents with  ? Posterior Vitreous Detachment  ? ? ? ? ?HISTORY OF PRESENT ILLNESS: ?Todd Mcneil is a 61 y.o. male who presents to the clinic today for:  ? ?HPI   ?1 yr fu OU oct. ?Patient states vision is stable and unchanged since last visit. Denies any new floaters or FOL. ?Pt states he saw Dr. Marchelle Gearing around October or November he had a laser for a "big floater" in his right eye. ?Pt is not using any prescription eye drops. ? ?Last edited by Nelva Nay on 10/07/2021  7:57 AM.  ?  ? ? ?Referring physician: ?Sallye Lat, MD ?[address] ?[address] ?[place],  [place] [address] ? ?HISTORICAL INFORMATION:  ? ?Selected notes from the MEDICAL RECORD NUMBER ?  ? ?Lab Results  ?Component Value Date  ? HGBA1C 5.8 (H) 03/02/2018  ?  ? ?CURRENT MEDICATIONS: ?No current outpatient medications on file. (Ophthalmic Drugs)  ? ?No current facility-administered medications for this visit. (Ophthalmic Drugs)  ? ?Current Outpatient Medications (Other)  ?Medication Sig  ? acetaminophen (TYLENOL) 500 MG tablet Take 1,000 mg by mouth every 6 (six) hours as needed for mild pain.  ? cetirizine (ZYRTEC) 10 MG tablet Take 10 mg by mouth as needed for allergies.   ? Cholecalciferol (VITAMIN D) 2000 UNITS tablet Take 4,000 Units by mouth once a week.   ? cyclobenzaprine (FLEXERIL) 10 MG tablet Take 5-10 mg by mouth every 8 (eight) hours as needed for muscle spasms.  ? glucosamine-chondroitin 500-400 MG tablet Take 1 tablet by mouth once a week.   ? levothyroxine (SYNTHROID, LEVOTHROID) 100 MCG tablet Take 100 mcg by mouth daily before breakfast.  ? linagliptin (TRADJENTA) 5 MG TABS tablet Take 5 mg by mouth daily.  ? losartan (COZAAR) 25 MG tablet Take 25 mg by mouth daily.  ? metFORMIN (GLUCOPHAGE) 1000 MG tablet Take 500 mg by mouth 2 (two) times daily with a meal.  ? naproxen sodium (ANAPROX) 220 MG tablet Take 440 mg by mouth every 8  (eight) hours as needed (for pain).  ? ofloxacin (FLOXIN) 0.3 % OTIC solution Place 10 drops into the right ear daily.  ? oxyCODONE (OXY IR/ROXICODONE) 5 MG immediate release tablet Take 1-2 tablets (5-10 mg total) by mouth every 6 (six) hours as needed for moderate pain, severe pain or breakthrough pain.  ? rosuvastatin (CRESTOR) 5 MG tablet Take 5 mg by mouth every evening.  ? tadalafil (CIALIS) 5 MG tablet Take 5 mg by mouth daily as needed for erectile dysfunction.  ? ?No current facility-administered medications for this visit. (Other)  ? ? ? ? ?REVIEW OF SYSTEMS: ?ROS   ?Negative for: Constitutional, Gastrointestinal, Neurological, Skin, Genitourinary, Musculoskeletal, HENT, Endocrine, Cardiovascular, Eyes, Respiratory, Psychiatric, Allergic/Imm, Heme/Lymph ?Last edited by Edmon Crape, MD on 10/07/2021  8:51 AM.  ?  ? ? ? ?ALLERGIES ?No Known Allergies ? ?PAST MEDICAL HISTORY ?Past Medical History:  ?Diagnosis Date  ? Complication of anesthesia   ? prolonged sedation after oral surgery  ? Diabetes mellitus without complication (HCC)   ? Fatty liver disease, nonalcoholic   ? Hypothyroidism   ? Recurrent right inguinal hernia 01/22/2018  ? Right-sided sensorineural hearing loss   ? Sleep apnea   ? no longer uses CPAP  ? Vitamin D deficiency   ? ?Past Surgical History:  ?Procedure Laterality Date  ? COLONOSCOPY    ?  ear drum reconstruction surgery    ? HERNIA REPAIR  12/1993  ? bilateral inguinal  ? INGUINAL HERNIA REPAIR Left 09/22/2014  ? Procedure: LAPAROSCOPIC POSSIBLE OPEN LEFT INGUINAL HERNIA;  Surgeon: Luretha Murphy, MD;  Location: [facility];  Service: General;  Laterality: Left;  With MESH  ? INSERTION OF MESH  03/07/2018  ? Procedure: INSERTION OF MESH;  Surgeon: Karie Soda, MD;  Location: [facility];  Service: General;;  ? LYSIS OF ADHESION  03/07/2018  ? Procedure: LYSIS OF ADHESIONS;  Surgeon: Karie Soda, MD;  Location: [facility];  Service: General;;  ? MOUTH SURGERY    ? MYRINGOTOMY WITH TUBE PLACEMENT  2014   ? bilateral - tubes in at present  ? UMBILICAL HERNIA REPAIR Right 03/07/2018  ? Procedure: LAPAROSCOPIC RIGHT INGUINAL HERNIA REPAIR, PRIMARY UMBILICAL HERNIA REPAIR, RIGHT TAP BLOCK;  Surgeon: Karie Soda, MD;  Location: [facility];  Service: General;  Laterality: Right;  ? ? ?FAMILY HISTORY ?History reviewed. No pertinent family history. ? ?SOCIAL HISTORY ?Social History  ? ?Tobacco Use  ? Smoking status: Every Day  ?  Packs/day: 0.50  ?  Years: 38.00  ?  Pack years: 19.00  ?  Types: Cigarettes  ? Smokeless tobacco: Never  ?Vaping Use  ? Vaping Use: Never used  ?Substance Use Topics  ? Alcohol use: Not Currently  ?  Comment: occasional  ? Drug use: Yes  ?  Types: Marijuana  ?  Comment: occ  ? ?  ? ?  ? ?OPHTHALMIC EXAM: ? ?Base Eye Exam   ? ? Visual Acuity (ETDRS)   ? ?   Right Left  ? Dist [place] 20/40 +2 20/20  ? ?  ?  ? ? Tonometry (Tonopen, 8:00 AM)   ? ?   Right Left  ? Pressure 9 9  ? ?  ?  ? ? Pupils   ? ?   Pupils Dark Light APD  ? Right PERRL 5 4 None  ? Left PERRL 5 4 None  ? ?  ?  ? ? Visual Fields (Counting fingers)   ? ?   Left Right  ?  Full Full  ? ?  ?  ? ? Extraocular Movement   ? ?   Right Left  ?  Full Full  ? ?  ?  ? ? Neuro/Psych   ? ? Oriented x3: Yes  ? Mood/Affect: Normal  ? ?  ?  ? ? Dilation   ? ? Both eyes: 1.0% Mydriacyl, 2.5% Phenylephrine @ 7:57 AM  ? ?  ?  ? ?  ? ? ?IMAGING AND PROCEDURES  ?Imaging and Procedures for 10/07/21 ? ?OCT, Retina - OU - Both Eyes   ? ?   ?Right Eye ?Quality was good. Scan locations included subfoveal. Progression has been stable. Findings include normal foveal contour.  ? ?Left Eye ?Quality was good. Progression has been stable. Findings include normal foveal contour.  ? ?  ? ? ?  ?  ? ?  ?ASSESSMENT/PLAN: ? ?Posterior vitreous detachment, left eye ? The nature of posterior vitreous detachment was discussed with the patient as well as its physiology, its age prevalence, and its possible implication regarding retinal breaks and detachment.  An informational  brochure was offered to the patient.  All the patient's questions were answered.  The patient was asked to return if new or different flashes or floaters develops.   Patient was instructed to contact office immediately if any new changes were noticed. ?I  explained to the patient that vitreous inside the eye is similar to jello inside a bowl. As the jello melts it can start to pull away from the bowl, similarly the vitreous throughout our lives can begin to pull away from the retina. That process is called a posterior vitreous detachment. In some cases, the vitreous can tug hard enough on the retina to form a retinal tear. I discussed with the patient the signs and symptoms of a retinal detachment.  Do not rub the eye. ?No new breaks ? ? ?Left retinal lattice degeneration ?OS, no new retinal breaks outside of prior pigmentary change or photocoagulation scar ? ?Pseudophakia, both eyes ?OU look great, open PC ? ?Diabetes mellitus without complication (HCC) ?No detectable diabetic retinopathy ? ?History of retinal detachment ?Repair via vitrectomy endolaser 2019 doing well OD  ? ?  ICD-10-CM   ?1. Posterior vitreous detachment, left eye  H43.812 OCT, Retina - OU - Both Eyes  ?  ?2. Left retinal lattice degeneration  H35.412 OCT, Retina - OU - Both Eyes  ?  ?3. Pseudophakia, both eyes  Z96.1   ?  ?4. Diabetes mellitus without complication (HCC)  E11.9   ?  ?5. History of retinal detachment  Z86.69   ?  ? ? ?1.  OD, looks great status post retinal detachment repair 2019 ? ?2.  OS ,PVD, no holes or tears with prior lattice degeneration. ? ?3. ? ?Ophthalmic Meds Ordered this visit:  ?No orders of the defined types were placed in this encounter. ? ? ?  ? ?Return for DILATE OU, COLOR FP, OCT. ? ?There are no Patient Instructions on file for this visit. ? ? ?Explained the diagnoses, plan, and follow up with the patient and they expressed understanding.  Patient expressed understanding of the importance of proper follow up  care.  ? ?Alford HighlandGary A. Addy Mcmannis M.D. ?Diseases & Surgery of the Retina and Vitreous ?[facility] ?10/07/21 ? ? ? ? ?Abbreviations: ?M myopia (nearsighted); A astigmatism; H hyperopia (farsighted); P

## 2021-11-23 DIAGNOSIS — J209 Acute bronchitis, unspecified: Secondary | ICD-10-CM | POA: Diagnosis not present

## 2021-11-23 DIAGNOSIS — J019 Acute sinusitis, unspecified: Secondary | ICD-10-CM | POA: Diagnosis not present

## 2021-11-23 DIAGNOSIS — R059 Cough, unspecified: Secondary | ICD-10-CM | POA: Diagnosis not present

## 2021-11-23 DIAGNOSIS — H9203 Otalgia, bilateral: Secondary | ICD-10-CM | POA: Diagnosis not present

## 2021-12-14 DIAGNOSIS — E039 Hypothyroidism, unspecified: Secondary | ICD-10-CM | POA: Diagnosis not present

## 2021-12-14 DIAGNOSIS — E782 Mixed hyperlipidemia: Secondary | ICD-10-CM | POA: Diagnosis not present

## 2021-12-14 DIAGNOSIS — N529 Male erectile dysfunction, unspecified: Secondary | ICD-10-CM | POA: Diagnosis not present

## 2021-12-14 DIAGNOSIS — E1169 Type 2 diabetes mellitus with other specified complication: Secondary | ICD-10-CM | POA: Diagnosis not present

## 2022-01-24 ENCOUNTER — Other Ambulatory Visit: Payer: Self-pay | Admitting: Physician Assistant

## 2022-01-24 DIAGNOSIS — R1011 Right upper quadrant pain: Secondary | ICD-10-CM

## 2022-01-24 DIAGNOSIS — R110 Nausea: Secondary | ICD-10-CM | POA: Diagnosis not present

## 2022-01-24 DIAGNOSIS — R1900 Intra-abdominal and pelvic swelling, mass and lump, unspecified site: Secondary | ICD-10-CM | POA: Diagnosis not present

## 2022-01-25 ENCOUNTER — Other Ambulatory Visit: Payer: BC Managed Care – PPO

## 2022-01-27 ENCOUNTER — Other Ambulatory Visit: Payer: BC Managed Care – PPO

## 2022-01-27 ENCOUNTER — Other Ambulatory Visit: Payer: Self-pay | Admitting: Physician Assistant

## 2022-01-27 DIAGNOSIS — R748 Abnormal levels of other serum enzymes: Secondary | ICD-10-CM

## 2022-01-28 ENCOUNTER — Ambulatory Visit
Admission: RE | Admit: 2022-01-28 | Discharge: 2022-01-28 | Disposition: A | Discharge status: Home / Self Care | Payer: BC Managed Care – PPO | Source: Ambulatory Visit | Attending: Physician Assistant | Admitting: Physician Assistant

## 2022-01-28 DIAGNOSIS — H6613 Chronic tubotympanic suppurative otitis media, bilateral: Secondary | ICD-10-CM | POA: Diagnosis not present

## 2022-01-28 DIAGNOSIS — R945 Abnormal results of liver function studies: Secondary | ICD-10-CM | POA: Diagnosis not present

## 2022-01-28 DIAGNOSIS — H60333 Swimmer's ear, bilateral: Secondary | ICD-10-CM | POA: Diagnosis not present

## 2022-01-28 DIAGNOSIS — H7013 Chronic mastoiditis, bilateral: Secondary | ICD-10-CM | POA: Diagnosis not present

## 2022-01-28 DIAGNOSIS — F17210 Nicotine dependence, cigarettes, uncomplicated: Secondary | ICD-10-CM | POA: Diagnosis not present

## 2022-01-28 DIAGNOSIS — R748 Abnormal levels of other serum enzymes: Secondary | ICD-10-CM

## 2022-01-28 DIAGNOSIS — R1110 Vomiting, unspecified: Secondary | ICD-10-CM | POA: Diagnosis not present

## 2022-01-28 DIAGNOSIS — H6693 Otitis media, unspecified, bilateral: Secondary | ICD-10-CM | POA: Diagnosis not present

## 2022-01-28 DIAGNOSIS — Z4589 Encounter for adjustment and management of other implanted devices: Secondary | ICD-10-CM | POA: Diagnosis not present

## 2022-02-03 DIAGNOSIS — H7093 Unspecified mastoiditis, bilateral: Secondary | ICD-10-CM | POA: Diagnosis not present

## 2022-02-03 DIAGNOSIS — H906 Mixed conductive and sensorineural hearing loss, bilateral: Secondary | ICD-10-CM | POA: Diagnosis not present

## 2022-02-03 DIAGNOSIS — H6693 Otitis media, unspecified, bilateral: Secondary | ICD-10-CM | POA: Diagnosis not present

## 2022-02-03 DIAGNOSIS — Z9622 Myringotomy tube(s) status: Secondary | ICD-10-CM | POA: Diagnosis not present

## 2022-02-03 DIAGNOSIS — Z01118 Encounter for examination of ears and hearing with other abnormal findings: Secondary | ICD-10-CM | POA: Diagnosis not present

## 2022-02-09 DIAGNOSIS — H6613 Chronic tubotympanic suppurative otitis media, bilateral: Secondary | ICD-10-CM | POA: Diagnosis not present

## 2022-02-09 DIAGNOSIS — H7013 Chronic mastoiditis, bilateral: Secondary | ICD-10-CM | POA: Diagnosis not present

## 2022-02-10 DIAGNOSIS — H903 Sensorineural hearing loss, bilateral: Secondary | ICD-10-CM | POA: Diagnosis not present

## 2022-02-10 DIAGNOSIS — I10 Essential (primary) hypertension: Secondary | ICD-10-CM | POA: Diagnosis not present

## 2022-02-10 DIAGNOSIS — E119 Type 2 diabetes mellitus without complications: Secondary | ICD-10-CM | POA: Diagnosis not present

## 2022-02-10 DIAGNOSIS — F17210 Nicotine dependence, cigarettes, uncomplicated: Secondary | ICD-10-CM | POA: Diagnosis not present

## 2022-02-10 DIAGNOSIS — Z4589 Encounter for adjustment and management of other implanted devices: Secondary | ICD-10-CM | POA: Diagnosis not present

## 2022-02-10 DIAGNOSIS — H6993 Unspecified Eustachian tube disorder, bilateral: Secondary | ICD-10-CM | POA: Diagnosis not present

## 2022-02-10 DIAGNOSIS — R079 Chest pain, unspecified: Secondary | ICD-10-CM | POA: Diagnosis not present

## 2022-02-10 DIAGNOSIS — E669 Obesity, unspecified: Secondary | ICD-10-CM | POA: Diagnosis not present

## 2022-02-10 DIAGNOSIS — Z9229 Personal history of other drug therapy: Secondary | ICD-10-CM | POA: Diagnosis not present

## 2022-02-10 DIAGNOSIS — H6983 Other specified disorders of Eustachian tube, bilateral: Secondary | ICD-10-CM | POA: Diagnosis not present

## 2022-02-10 DIAGNOSIS — Z8669 Personal history of other diseases of the nervous system and sense organs: Secondary | ICD-10-CM | POA: Diagnosis not present

## 2022-02-10 DIAGNOSIS — E785 Hyperlipidemia, unspecified: Secondary | ICD-10-CM | POA: Diagnosis not present

## 2022-03-17 DIAGNOSIS — E782 Mixed hyperlipidemia: Secondary | ICD-10-CM | POA: Diagnosis not present

## 2022-03-17 DIAGNOSIS — E1169 Type 2 diabetes mellitus with other specified complication: Secondary | ICD-10-CM | POA: Diagnosis not present

## 2022-03-18 DIAGNOSIS — E782 Mixed hyperlipidemia: Secondary | ICD-10-CM | POA: Diagnosis not present

## 2022-03-18 DIAGNOSIS — N529 Male erectile dysfunction, unspecified: Secondary | ICD-10-CM | POA: Diagnosis not present

## 2022-03-18 DIAGNOSIS — E039 Hypothyroidism, unspecified: Secondary | ICD-10-CM | POA: Diagnosis not present

## 2022-03-18 DIAGNOSIS — E1169 Type 2 diabetes mellitus with other specified complication: Secondary | ICD-10-CM | POA: Diagnosis not present

## 2022-03-28 ENCOUNTER — Other Ambulatory Visit: Payer: Self-pay | Admitting: Nurse Practitioner

## 2022-03-28 DIAGNOSIS — R748 Abnormal levels of other serum enzymes: Secondary | ICD-10-CM

## 2022-03-28 DIAGNOSIS — K760 Fatty (change of) liver, not elsewhere classified: Secondary | ICD-10-CM | POA: Diagnosis not present

## 2022-03-28 DIAGNOSIS — K7469 Other cirrhosis of liver: Secondary | ICD-10-CM

## 2022-04-06 ENCOUNTER — Ambulatory Visit
Admission: RE | Admit: 2022-04-06 | Discharge: 2022-04-06 | Disposition: A | Discharge status: Home / Self Care | Payer: BC Managed Care – PPO | Source: Ambulatory Visit | Attending: Nurse Practitioner | Admitting: Nurse Practitioner

## 2022-04-06 DIAGNOSIS — R748 Abnormal levels of other serum enzymes: Secondary | ICD-10-CM

## 2022-04-06 DIAGNOSIS — K7460 Unspecified cirrhosis of liver: Secondary | ICD-10-CM | POA: Diagnosis not present

## 2022-04-06 DIAGNOSIS — K7469 Other cirrhosis of liver: Secondary | ICD-10-CM

## 2022-04-06 DIAGNOSIS — K760 Fatty (change of) liver, not elsewhere classified: Secondary | ICD-10-CM

## 2022-04-14 DIAGNOSIS — R079 Chest pain, unspecified: Secondary | ICD-10-CM | POA: Diagnosis not present

## 2022-04-26 DIAGNOSIS — R112 Nausea with vomiting, unspecified: Secondary | ICD-10-CM | POA: Diagnosis not present

## 2022-04-26 DIAGNOSIS — K760 Fatty (change of) liver, not elsewhere classified: Secondary | ICD-10-CM | POA: Diagnosis not present

## 2022-04-26 DIAGNOSIS — Z1211 Encounter for screening for malignant neoplasm of colon: Secondary | ICD-10-CM | POA: Diagnosis not present

## 2022-04-26 DIAGNOSIS — T383X5A Adverse effect of insulin and oral hypoglycemic [antidiabetic] drugs, initial encounter: Secondary | ICD-10-CM | POA: Diagnosis not present

## 2022-04-29 DIAGNOSIS — K760 Fatty (change of) liver, not elsewhere classified: Secondary | ICD-10-CM | POA: Diagnosis not present

## 2022-04-29 DIAGNOSIS — K7469 Other cirrhosis of liver: Secondary | ICD-10-CM | POA: Diagnosis not present

## 2022-04-29 DIAGNOSIS — R748 Abnormal levels of other serum enzymes: Secondary | ICD-10-CM | POA: Diagnosis not present

## 2022-05-12 DIAGNOSIS — Z881 Allergy status to other antibiotic agents status: Secondary | ICD-10-CM | POA: Diagnosis not present

## 2022-05-12 DIAGNOSIS — H6612 Chronic tubotympanic suppurative otitis media, left ear: Secondary | ICD-10-CM | POA: Diagnosis not present

## 2022-05-12 DIAGNOSIS — H7013 Chronic mastoiditis, bilateral: Secondary | ICD-10-CM | POA: Diagnosis not present

## 2022-05-12 DIAGNOSIS — H906 Mixed conductive and sensorineural hearing loss, bilateral: Secondary | ICD-10-CM | POA: Diagnosis not present

## 2022-05-12 DIAGNOSIS — Z4589 Encounter for adjustment and management of other implanted devices: Secondary | ICD-10-CM | POA: Diagnosis not present

## 2022-05-12 DIAGNOSIS — H9212 Otorrhea, left ear: Secondary | ICD-10-CM | POA: Diagnosis not present

## 2022-05-12 DIAGNOSIS — H6993 Unspecified Eustachian tube disorder, bilateral: Secondary | ICD-10-CM | POA: Diagnosis not present

## 2022-05-12 DIAGNOSIS — F17210 Nicotine dependence, cigarettes, uncomplicated: Secondary | ICD-10-CM | POA: Diagnosis not present

## 2022-06-03 DIAGNOSIS — E559 Vitamin D deficiency, unspecified: Secondary | ICD-10-CM | POA: Diagnosis not present

## 2022-06-03 DIAGNOSIS — E1169 Type 2 diabetes mellitus with other specified complication: Secondary | ICD-10-CM | POA: Diagnosis not present

## 2022-06-03 DIAGNOSIS — E782 Mixed hyperlipidemia: Secondary | ICD-10-CM | POA: Diagnosis not present

## 2022-06-20 DIAGNOSIS — Z0000 Encounter for general adult medical examination without abnormal findings: Secondary | ICD-10-CM | POA: Diagnosis not present

## 2022-06-20 DIAGNOSIS — E039 Hypothyroidism, unspecified: Secondary | ICD-10-CM | POA: Diagnosis not present

## 2022-06-20 DIAGNOSIS — Z23 Encounter for immunization: Secondary | ICD-10-CM | POA: Diagnosis not present

## 2022-06-20 DIAGNOSIS — E782 Mixed hyperlipidemia: Secondary | ICD-10-CM | POA: Diagnosis not present

## 2022-06-20 DIAGNOSIS — E1169 Type 2 diabetes mellitus with other specified complication: Secondary | ICD-10-CM | POA: Diagnosis not present

## 2022-06-20 DIAGNOSIS — Z125 Encounter for screening for malignant neoplasm of prostate: Secondary | ICD-10-CM | POA: Diagnosis not present

## 2022-06-20 DIAGNOSIS — N529 Male erectile dysfunction, unspecified: Secondary | ICD-10-CM | POA: Diagnosis not present

## 2022-06-23 DIAGNOSIS — Z961 Presence of intraocular lens: Secondary | ICD-10-CM | POA: Diagnosis not present

## 2022-06-23 DIAGNOSIS — E119 Type 2 diabetes mellitus without complications: Secondary | ICD-10-CM | POA: Diagnosis not present

## 2022-06-23 DIAGNOSIS — H31093 Other chorioretinal scars, bilateral: Secondary | ICD-10-CM | POA: Diagnosis not present

## 2022-06-23 DIAGNOSIS — H35413 Lattice degeneration of retina, bilateral: Secondary | ICD-10-CM | POA: Diagnosis not present

## 2022-07-07 DIAGNOSIS — H7102 Cholesteatoma of attic, left ear: Secondary | ICD-10-CM | POA: Diagnosis not present

## 2022-07-07 DIAGNOSIS — H906 Mixed conductive and sensorineural hearing loss, bilateral: Secondary | ICD-10-CM | POA: Diagnosis not present

## 2022-07-07 DIAGNOSIS — H7013 Chronic mastoiditis, bilateral: Secondary | ICD-10-CM | POA: Diagnosis not present

## 2022-07-07 DIAGNOSIS — Z4589 Encounter for adjustment and management of other implanted devices: Secondary | ICD-10-CM | POA: Diagnosis not present

## 2022-07-11 DIAGNOSIS — F17210 Nicotine dependence, cigarettes, uncomplicated: Secondary | ICD-10-CM | POA: Diagnosis not present

## 2022-07-11 DIAGNOSIS — H7012 Chronic mastoiditis, left ear: Secondary | ICD-10-CM | POA: Diagnosis not present

## 2022-07-11 DIAGNOSIS — G9600 Cerebrospinal fluid leak, unspecified: Secondary | ICD-10-CM | POA: Diagnosis not present

## 2022-07-11 DIAGNOSIS — H6613 Chronic tubotympanic suppurative otitis media, bilateral: Secondary | ICD-10-CM | POA: Diagnosis not present

## 2022-07-11 DIAGNOSIS — H6993 Unspecified Eustachian tube disorder, bilateral: Secondary | ICD-10-CM | POA: Diagnosis not present

## 2022-07-11 DIAGNOSIS — Q018 Encephalocele of other sites: Secondary | ICD-10-CM | POA: Diagnosis not present

## 2022-07-11 DIAGNOSIS — Z6832 Body mass index (BMI) 32.0-32.9, adult: Secondary | ICD-10-CM | POA: Diagnosis not present

## 2022-07-11 DIAGNOSIS — H838X2 Other specified diseases of left inner ear: Secondary | ICD-10-CM | POA: Diagnosis not present

## 2022-07-11 DIAGNOSIS — G4733 Obstructive sleep apnea (adult) (pediatric): Secondary | ICD-10-CM | POA: Diagnosis not present

## 2022-07-11 DIAGNOSIS — E039 Hypothyroidism, unspecified: Secondary | ICD-10-CM | POA: Diagnosis not present

## 2022-07-11 DIAGNOSIS — E119 Type 2 diabetes mellitus without complications: Secondary | ICD-10-CM | POA: Diagnosis not present

## 2022-07-11 DIAGNOSIS — K760 Fatty (change of) liver, not elsewhere classified: Secondary | ICD-10-CM | POA: Diagnosis not present

## 2022-07-11 DIAGNOSIS — H906 Mixed conductive and sensorineural hearing loss, bilateral: Secondary | ICD-10-CM | POA: Diagnosis not present

## 2022-07-11 DIAGNOSIS — K219 Gastro-esophageal reflux disease without esophagitis: Secondary | ICD-10-CM | POA: Diagnosis not present

## 2022-07-11 DIAGNOSIS — H6983 Other specified disorders of Eustachian tube, bilateral: Secondary | ICD-10-CM | POA: Diagnosis not present

## 2022-07-11 DIAGNOSIS — H7013 Chronic mastoiditis, bilateral: Secondary | ICD-10-CM | POA: Diagnosis not present

## 2022-07-11 DIAGNOSIS — E669 Obesity, unspecified: Secondary | ICD-10-CM | POA: Diagnosis not present

## 2022-07-11 DIAGNOSIS — E785 Hyperlipidemia, unspecified: Secondary | ICD-10-CM | POA: Diagnosis not present

## 2022-07-11 DIAGNOSIS — H7102 Cholesteatoma of attic, left ear: Secondary | ICD-10-CM | POA: Diagnosis not present

## 2022-07-13 DIAGNOSIS — H7012 Chronic mastoiditis, left ear: Secondary | ICD-10-CM | POA: Diagnosis not present

## 2022-07-13 DIAGNOSIS — H906 Mixed conductive and sensorineural hearing loss, bilateral: Secondary | ICD-10-CM | POA: Diagnosis not present

## 2022-07-13 DIAGNOSIS — H6993 Unspecified Eustachian tube disorder, bilateral: Secondary | ICD-10-CM | POA: Diagnosis not present

## 2022-07-13 DIAGNOSIS — H838X2 Other specified diseases of left inner ear: Secondary | ICD-10-CM | POA: Diagnosis not present

## 2022-08-02 DIAGNOSIS — H338 Other retinal detachments: Secondary | ICD-10-CM | POA: Diagnosis not present

## 2022-08-02 DIAGNOSIS — H59811 Chorioretinal scars after surgery for detachment, right eye: Secondary | ICD-10-CM | POA: Diagnosis not present

## 2022-08-02 DIAGNOSIS — H31092 Other chorioretinal scars, left eye: Secondary | ICD-10-CM | POA: Diagnosis not present

## 2022-08-02 DIAGNOSIS — H35372 Puckering of macula, left eye: Secondary | ICD-10-CM | POA: Diagnosis not present

## 2022-08-17 DIAGNOSIS — G4733 Obstructive sleep apnea (adult) (pediatric): Secondary | ICD-10-CM | POA: Diagnosis not present

## 2022-08-17 DIAGNOSIS — Z7984 Long term (current) use of oral hypoglycemic drugs: Secondary | ICD-10-CM | POA: Diagnosis not present

## 2022-08-17 DIAGNOSIS — K648 Other hemorrhoids: Secondary | ICD-10-CM | POA: Diagnosis not present

## 2022-08-17 DIAGNOSIS — K760 Fatty (change of) liver, not elsewhere classified: Secondary | ICD-10-CM | POA: Diagnosis not present

## 2022-08-17 DIAGNOSIS — Z1211 Encounter for screening for malignant neoplasm of colon: Secondary | ICD-10-CM | POA: Diagnosis not present

## 2022-08-17 DIAGNOSIS — R112 Nausea with vomiting, unspecified: Secondary | ICD-10-CM | POA: Diagnosis not present

## 2022-08-17 DIAGNOSIS — K644 Residual hemorrhoidal skin tags: Secondary | ICD-10-CM | POA: Diagnosis not present

## 2022-08-17 DIAGNOSIS — K3189 Other diseases of stomach and duodenum: Secondary | ICD-10-CM | POA: Diagnosis not present

## 2022-08-17 DIAGNOSIS — Z9889 Other specified postprocedural states: Secondary | ICD-10-CM | POA: Diagnosis not present

## 2022-08-17 DIAGNOSIS — Z7985 Long-term (current) use of injectable non-insulin antidiabetic drugs: Secondary | ICD-10-CM | POA: Diagnosis not present

## 2022-08-17 DIAGNOSIS — E119 Type 2 diabetes mellitus without complications: Secondary | ICD-10-CM | POA: Diagnosis not present

## 2022-08-18 DIAGNOSIS — H7102 Cholesteatoma of attic, left ear: Secondary | ICD-10-CM | POA: Diagnosis not present

## 2022-08-18 DIAGNOSIS — Q019 Encephalocele, unspecified: Secondary | ICD-10-CM | POA: Diagnosis not present

## 2022-08-18 DIAGNOSIS — H6993 Unspecified Eustachian tube disorder, bilateral: Secondary | ICD-10-CM | POA: Diagnosis not present

## 2022-08-18 DIAGNOSIS — H7013 Chronic mastoiditis, bilateral: Secondary | ICD-10-CM | POA: Diagnosis not present

## 2022-09-09 DIAGNOSIS — H9213 Otorrhea, bilateral: Secondary | ICD-10-CM | POA: Diagnosis not present

## 2022-09-09 DIAGNOSIS — E119 Type 2 diabetes mellitus without complications: Secondary | ICD-10-CM | POA: Diagnosis not present

## 2022-09-09 DIAGNOSIS — Z9089 Acquired absence of other organs: Secondary | ICD-10-CM | POA: Diagnosis not present

## 2022-09-09 DIAGNOSIS — H6532 Chronic mucoid otitis media, left ear: Secondary | ICD-10-CM | POA: Diagnosis not present

## 2022-09-09 DIAGNOSIS — Z79899 Other long term (current) drug therapy: Secondary | ICD-10-CM | POA: Diagnosis not present

## 2022-09-09 DIAGNOSIS — H906 Mixed conductive and sensorineural hearing loss, bilateral: Secondary | ICD-10-CM | POA: Diagnosis not present

## 2022-09-09 DIAGNOSIS — Z4589 Encounter for adjustment and management of other implanted devices: Secondary | ICD-10-CM | POA: Diagnosis not present

## 2022-09-09 DIAGNOSIS — Z7984 Long term (current) use of oral hypoglycemic drugs: Secondary | ICD-10-CM | POA: Diagnosis not present

## 2022-09-28 DIAGNOSIS — R748 Abnormal levels of other serum enzymes: Secondary | ICD-10-CM | POA: Diagnosis not present

## 2022-09-28 DIAGNOSIS — K7469 Other cirrhosis of liver: Secondary | ICD-10-CM | POA: Diagnosis not present

## 2022-09-28 DIAGNOSIS — K760 Fatty (change of) liver, not elsewhere classified: Secondary | ICD-10-CM | POA: Diagnosis not present

## 2022-09-29 ENCOUNTER — Other Ambulatory Visit: Payer: Self-pay

## 2022-09-29 DIAGNOSIS — K760 Fatty (change of) liver, not elsewhere classified: Secondary | ICD-10-CM

## 2022-09-29 DIAGNOSIS — K7469 Other cirrhosis of liver: Secondary | ICD-10-CM

## 2022-10-06 ENCOUNTER — Other Ambulatory Visit: Payer: Self-pay | Admitting: Nurse Practitioner

## 2022-10-06 DIAGNOSIS — K760 Fatty (change of) liver, not elsewhere classified: Secondary | ICD-10-CM

## 2022-10-06 DIAGNOSIS — K7469 Other cirrhosis of liver: Secondary | ICD-10-CM

## 2022-10-10 ENCOUNTER — Encounter (INDEPENDENT_AMBULATORY_CARE_PROVIDER_SITE_OTHER): Payer: BC Managed Care – PPO | Admitting: Ophthalmology

## 2022-10-20 DIAGNOSIS — Z9089 Acquired absence of other organs: Secondary | ICD-10-CM | POA: Diagnosis not present

## 2022-10-20 DIAGNOSIS — Q019 Encephalocele, unspecified: Secondary | ICD-10-CM | POA: Diagnosis not present

## 2022-10-20 DIAGNOSIS — H906 Mixed conductive and sensorineural hearing loss, bilateral: Secondary | ICD-10-CM | POA: Diagnosis not present

## 2022-10-20 DIAGNOSIS — Z4589 Encounter for adjustment and management of other implanted devices: Secondary | ICD-10-CM | POA: Diagnosis not present

## 2022-10-26 DIAGNOSIS — H35033 Hypertensive retinopathy, bilateral: Secondary | ICD-10-CM | POA: Diagnosis not present

## 2022-10-26 DIAGNOSIS — H35372 Puckering of macula, left eye: Secondary | ICD-10-CM | POA: Diagnosis not present

## 2022-10-26 DIAGNOSIS — H338 Other retinal detachments: Secondary | ICD-10-CM | POA: Diagnosis not present

## 2022-10-26 DIAGNOSIS — H43391 Other vitreous opacities, right eye: Secondary | ICD-10-CM | POA: Diagnosis not present

## 2022-10-31 ENCOUNTER — Ambulatory Visit
Admission: RE | Admit: 2022-10-31 | Discharge: 2022-10-31 | Disposition: A | Discharge status: Home / Self Care | Payer: BC Managed Care – PPO | Source: Ambulatory Visit | Attending: Nurse Practitioner | Admitting: Nurse Practitioner

## 2022-10-31 DIAGNOSIS — K760 Fatty (change of) liver, not elsewhere classified: Secondary | ICD-10-CM

## 2022-10-31 DIAGNOSIS — K7460 Unspecified cirrhosis of liver: Secondary | ICD-10-CM | POA: Diagnosis not present

## 2022-10-31 DIAGNOSIS — K7469 Other cirrhosis of liver: Secondary | ICD-10-CM

## 2022-12-20 DIAGNOSIS — E1169 Type 2 diabetes mellitus with other specified complication: Secondary | ICD-10-CM | POA: Diagnosis not present

## 2022-12-20 DIAGNOSIS — N529 Male erectile dysfunction, unspecified: Secondary | ICD-10-CM | POA: Diagnosis not present

## 2022-12-20 DIAGNOSIS — E782 Mixed hyperlipidemia: Secondary | ICD-10-CM | POA: Diagnosis not present

## 2022-12-20 DIAGNOSIS — E039 Hypothyroidism, unspecified: Secondary | ICD-10-CM | POA: Diagnosis not present

## 2022-12-20 DIAGNOSIS — E559 Vitamin D deficiency, unspecified: Secondary | ICD-10-CM | POA: Diagnosis not present

## 2023-03-10 DIAGNOSIS — H9193 Unspecified hearing loss, bilateral: Secondary | ICD-10-CM | POA: Diagnosis not present

## 2023-03-10 DIAGNOSIS — Q019 Encephalocele, unspecified: Secondary | ICD-10-CM | POA: Diagnosis not present

## 2023-03-10 DIAGNOSIS — H90A32 Mixed conductive and sensorineural hearing loss, unilateral, left ear with restricted hearing on the contralateral side: Secondary | ICD-10-CM | POA: Diagnosis not present

## 2023-03-10 DIAGNOSIS — E119 Type 2 diabetes mellitus without complications: Secondary | ICD-10-CM | POA: Diagnosis not present

## 2023-03-10 DIAGNOSIS — Z8669 Personal history of other diseases of the nervous system and sense organs: Secondary | ICD-10-CM | POA: Diagnosis not present

## 2023-03-10 DIAGNOSIS — Z48810 Encounter for surgical aftercare following surgery on the sense organs: Secondary | ICD-10-CM | POA: Diagnosis not present

## 2023-03-10 DIAGNOSIS — R432 Parageusia: Secondary | ICD-10-CM | POA: Diagnosis not present

## 2023-03-10 DIAGNOSIS — Z7984 Long term (current) use of oral hypoglycemic drugs: Secondary | ICD-10-CM | POA: Diagnosis not present

## 2023-03-10 DIAGNOSIS — Z9089 Acquired absence of other organs: Secondary | ICD-10-CM | POA: Diagnosis not present

## 2023-03-10 DIAGNOSIS — H90A21 Sensorineural hearing loss, unilateral, right ear, with restricted hearing on the contralateral side: Secondary | ICD-10-CM | POA: Diagnosis not present

## 2023-03-10 DIAGNOSIS — Z8614 Personal history of Methicillin resistant Staphylococcus aureus infection: Secondary | ICD-10-CM | POA: Diagnosis not present

## 2023-03-10 DIAGNOSIS — H906 Mixed conductive and sensorineural hearing loss, bilateral: Secondary | ICD-10-CM | POA: Diagnosis not present

## 2023-04-05 ENCOUNTER — Other Ambulatory Visit: Payer: Self-pay | Admitting: Nurse Practitioner

## 2023-04-05 DIAGNOSIS — R799 Abnormal finding of blood chemistry, unspecified: Secondary | ICD-10-CM | POA: Diagnosis not present

## 2023-04-05 DIAGNOSIS — K7581 Nonalcoholic steatohepatitis (NASH): Secondary | ICD-10-CM

## 2023-04-05 DIAGNOSIS — K7469 Other cirrhosis of liver: Secondary | ICD-10-CM

## 2023-04-05 DIAGNOSIS — R772 Abnormality of alphafetoprotein: Secondary | ICD-10-CM | POA: Diagnosis not present

## 2023-04-05 DIAGNOSIS — R791 Abnormal coagulation profile: Secondary | ICD-10-CM | POA: Diagnosis not present

## 2023-04-05 DIAGNOSIS — R945 Abnormal results of liver function studies: Secondary | ICD-10-CM | POA: Diagnosis not present

## 2023-04-11 ENCOUNTER — Ambulatory Visit
Admission: RE | Admit: 2023-04-11 | Discharge: 2023-04-11 | Disposition: A | Discharge status: Home / Self Care | Payer: BC Managed Care – PPO | Source: Ambulatory Visit | Attending: Nurse Practitioner

## 2023-04-11 DIAGNOSIS — N289 Disorder of kidney and ureter, unspecified: Secondary | ICD-10-CM | POA: Diagnosis not present

## 2023-04-11 DIAGNOSIS — K7581 Nonalcoholic steatohepatitis (NASH): Secondary | ICD-10-CM

## 2023-04-11 DIAGNOSIS — K7460 Unspecified cirrhosis of liver: Secondary | ICD-10-CM | POA: Diagnosis not present

## 2023-04-11 DIAGNOSIS — K7469 Other cirrhosis of liver: Secondary | ICD-10-CM

## 2023-05-11 DIAGNOSIS — H43812 Vitreous degeneration, left eye: Secondary | ICD-10-CM | POA: Diagnosis not present

## 2023-05-11 DIAGNOSIS — H43392 Other vitreous opacities, left eye: Secondary | ICD-10-CM | POA: Diagnosis not present

## 2023-05-11 DIAGNOSIS — H35372 Puckering of macula, left eye: Secondary | ICD-10-CM | POA: Diagnosis not present

## 2023-06-10 DIAGNOSIS — J018 Other acute sinusitis: Secondary | ICD-10-CM | POA: Diagnosis not present

## 2023-06-13 DIAGNOSIS — Z125 Encounter for screening for malignant neoplasm of prostate: Secondary | ICD-10-CM | POA: Diagnosis not present

## 2023-06-13 DIAGNOSIS — E782 Mixed hyperlipidemia: Secondary | ICD-10-CM | POA: Diagnosis not present

## 2023-06-13 DIAGNOSIS — E559 Vitamin D deficiency, unspecified: Secondary | ICD-10-CM | POA: Diagnosis not present

## 2023-06-13 DIAGNOSIS — E039 Hypothyroidism, unspecified: Secondary | ICD-10-CM | POA: Diagnosis not present

## 2023-06-13 DIAGNOSIS — E1169 Type 2 diabetes mellitus with other specified complication: Secondary | ICD-10-CM | POA: Diagnosis not present

## 2023-06-15 DIAGNOSIS — Z0000 Encounter for general adult medical examination without abnormal findings: Secondary | ICD-10-CM | POA: Diagnosis not present

## 2023-06-15 DIAGNOSIS — E1169 Type 2 diabetes mellitus with other specified complication: Secondary | ICD-10-CM | POA: Diagnosis not present

## 2023-06-15 DIAGNOSIS — I700 Atherosclerosis of aorta: Secondary | ICD-10-CM | POA: Diagnosis not present

## 2023-06-15 DIAGNOSIS — E039 Hypothyroidism, unspecified: Secondary | ICD-10-CM | POA: Diagnosis not present

## 2023-06-15 DIAGNOSIS — E782 Mixed hyperlipidemia: Secondary | ICD-10-CM | POA: Diagnosis not present

## 2023-07-03 DIAGNOSIS — E119 Type 2 diabetes mellitus without complications: Secondary | ICD-10-CM | POA: Diagnosis not present

## 2023-07-03 DIAGNOSIS — H35413 Lattice degeneration of retina, bilateral: Secondary | ICD-10-CM | POA: Diagnosis not present

## 2023-07-03 DIAGNOSIS — H31093 Other chorioretinal scars, bilateral: Secondary | ICD-10-CM | POA: Diagnosis not present

## 2023-07-03 DIAGNOSIS — H35373 Puckering of macula, bilateral: Secondary | ICD-10-CM | POA: Diagnosis not present

## 2023-08-01 DIAGNOSIS — E039 Hypothyroidism, unspecified: Secondary | ICD-10-CM | POA: Diagnosis not present

## 2023-08-21 ENCOUNTER — Other Ambulatory Visit: Payer: Self-pay | Admitting: Nurse Practitioner

## 2023-08-21 DIAGNOSIS — K7581 Nonalcoholic steatohepatitis (NASH): Secondary | ICD-10-CM

## 2023-08-21 DIAGNOSIS — K7469 Other cirrhosis of liver: Secondary | ICD-10-CM

## 2023-08-21 DIAGNOSIS — R748 Abnormal levels of other serum enzymes: Secondary | ICD-10-CM

## 2023-08-21 DIAGNOSIS — K760 Fatty (change of) liver, not elsewhere classified: Secondary | ICD-10-CM

## 2023-09-08 DIAGNOSIS — E119 Type 2 diabetes mellitus without complications: Secondary | ICD-10-CM | POA: Diagnosis not present

## 2023-09-08 DIAGNOSIS — Z9089 Acquired absence of other organs: Secondary | ICD-10-CM | POA: Diagnosis not present

## 2023-09-08 DIAGNOSIS — R432 Parageusia: Secondary | ICD-10-CM | POA: Diagnosis not present

## 2023-09-08 DIAGNOSIS — Z7984 Long term (current) use of oral hypoglycemic drugs: Secondary | ICD-10-CM | POA: Diagnosis not present

## 2023-09-08 DIAGNOSIS — G4489 Other headache syndrome: Secondary | ICD-10-CM | POA: Diagnosis not present

## 2023-09-08 DIAGNOSIS — Z9622 Myringotomy tube(s) status: Secondary | ICD-10-CM | POA: Diagnosis not present

## 2023-09-08 DIAGNOSIS — H906 Mixed conductive and sensorineural hearing loss, bilateral: Secondary | ICD-10-CM | POA: Diagnosis not present

## 2023-09-08 DIAGNOSIS — Z0110 Encounter for examination of ears and hearing without abnormal findings: Secondary | ICD-10-CM | POA: Diagnosis not present

## 2023-09-08 DIAGNOSIS — Z4589 Encounter for adjustment and management of other implanted devices: Secondary | ICD-10-CM | POA: Diagnosis not present

## 2023-09-08 DIAGNOSIS — Z79899 Other long term (current) drug therapy: Secondary | ICD-10-CM | POA: Diagnosis not present

## 2023-10-03 DIAGNOSIS — G4489 Other headache syndrome: Secondary | ICD-10-CM | POA: Diagnosis not present

## 2023-10-06 ENCOUNTER — Encounter: Payer: Self-pay | Admitting: Nurse Practitioner

## 2023-10-12 DIAGNOSIS — Q019 Encephalocele, unspecified: Secondary | ICD-10-CM | POA: Diagnosis not present

## 2023-10-12 DIAGNOSIS — H938X2 Other specified disorders of left ear: Secondary | ICD-10-CM | POA: Diagnosis not present

## 2023-10-12 DIAGNOSIS — Z4589 Encounter for adjustment and management of other implanted devices: Secondary | ICD-10-CM | POA: Diagnosis not present

## 2023-10-12 DIAGNOSIS — H90A32 Mixed conductive and sensorineural hearing loss, unilateral, left ear with restricted hearing on the contralateral side: Secondary | ICD-10-CM | POA: Diagnosis not present

## 2023-10-16 ENCOUNTER — Ambulatory Visit
Admission: RE | Admit: 2023-10-16 | Discharge: 2023-10-16 | Disposition: A | Discharge status: Home / Self Care | Payer: BC Managed Care – PPO | Source: Ambulatory Visit | Attending: Nurse Practitioner | Admitting: Nurse Practitioner

## 2023-10-16 DIAGNOSIS — K7581 Nonalcoholic steatohepatitis (NASH): Secondary | ICD-10-CM

## 2023-10-16 DIAGNOSIS — K760 Fatty (change of) liver, not elsewhere classified: Secondary | ICD-10-CM

## 2023-10-16 DIAGNOSIS — K7460 Unspecified cirrhosis of liver: Secondary | ICD-10-CM | POA: Diagnosis not present

## 2023-10-16 DIAGNOSIS — R748 Abnormal levels of other serum enzymes: Secondary | ICD-10-CM

## 2023-10-16 DIAGNOSIS — K7469 Other cirrhosis of liver: Secondary | ICD-10-CM

## 2023-11-21 DIAGNOSIS — Q019 Encephalocele, unspecified: Secondary | ICD-10-CM | POA: Diagnosis not present

## 2023-12-06 DIAGNOSIS — H938X2 Other specified disorders of left ear: Secondary | ICD-10-CM | POA: Diagnosis not present

## 2023-12-06 DIAGNOSIS — H90A32 Mixed conductive and sensorineural hearing loss, unilateral, left ear with restricted hearing on the contralateral side: Secondary | ICD-10-CM | POA: Diagnosis not present

## 2023-12-06 DIAGNOSIS — H6532 Chronic mucoid otitis media, left ear: Secondary | ICD-10-CM | POA: Diagnosis not present

## 2023-12-06 DIAGNOSIS — Z4589 Encounter for adjustment and management of other implanted devices: Secondary | ICD-10-CM | POA: Diagnosis not present

## 2023-12-06 DIAGNOSIS — Z9089 Acquired absence of other organs: Secondary | ICD-10-CM | POA: Diagnosis not present

## 2023-12-06 DIAGNOSIS — Q019 Encephalocele, unspecified: Secondary | ICD-10-CM | POA: Diagnosis not present

## 2023-12-06 DIAGNOSIS — H906 Mixed conductive and sensorineural hearing loss, bilateral: Secondary | ICD-10-CM | POA: Diagnosis not present

## 2023-12-06 DIAGNOSIS — H7013 Chronic mastoiditis, bilateral: Secondary | ICD-10-CM | POA: Diagnosis not present

## 2023-12-15 DIAGNOSIS — E559 Vitamin D deficiency, unspecified: Secondary | ICD-10-CM | POA: Diagnosis not present

## 2023-12-15 DIAGNOSIS — E1169 Type 2 diabetes mellitus with other specified complication: Secondary | ICD-10-CM | POA: Diagnosis not present

## 2023-12-15 DIAGNOSIS — N529 Male erectile dysfunction, unspecified: Secondary | ICD-10-CM | POA: Diagnosis not present

## 2023-12-15 DIAGNOSIS — E782 Mixed hyperlipidemia: Secondary | ICD-10-CM | POA: Diagnosis not present

## 2023-12-15 DIAGNOSIS — E039 Hypothyroidism, unspecified: Secondary | ICD-10-CM | POA: Diagnosis not present

## 2023-12-19 DIAGNOSIS — Q019 Encephalocele, unspecified: Secondary | ICD-10-CM | POA: Diagnosis not present

## 2023-12-22 DIAGNOSIS — Q165 Congenital malformation of inner ear: Secondary | ICD-10-CM | POA: Diagnosis not present

## 2024-02-21 DIAGNOSIS — E109 Type 1 diabetes mellitus without complications: Secondary | ICD-10-CM | POA: Diagnosis not present

## 2024-03-08 DIAGNOSIS — E039 Hypothyroidism, unspecified: Secondary | ICD-10-CM | POA: Diagnosis not present

## 2024-03-08 DIAGNOSIS — Q165 Congenital malformation of inner ear: Secondary | ICD-10-CM | POA: Diagnosis not present

## 2024-03-08 DIAGNOSIS — E785 Hyperlipidemia, unspecified: Secondary | ICD-10-CM | POA: Diagnosis not present

## 2024-03-08 DIAGNOSIS — K7581 Nonalcoholic steatohepatitis (NASH): Secondary | ICD-10-CM | POA: Diagnosis not present

## 2024-03-08 DIAGNOSIS — G9600 Cerebrospinal fluid leak, unspecified: Secondary | ICD-10-CM | POA: Diagnosis not present

## 2024-03-08 DIAGNOSIS — Q018 Encephalocele of other sites: Secondary | ICD-10-CM | POA: Diagnosis not present

## 2024-03-08 DIAGNOSIS — K7469 Other cirrhosis of liver: Secondary | ICD-10-CM | POA: Diagnosis not present

## 2024-03-08 DIAGNOSIS — Z7984 Long term (current) use of oral hypoglycemic drugs: Secondary | ICD-10-CM | POA: Diagnosis not present

## 2024-03-08 DIAGNOSIS — G9608 Other cranial cerebrospinal fluid leak: Secondary | ICD-10-CM | POA: Diagnosis not present

## 2024-03-08 DIAGNOSIS — G4733 Obstructive sleep apnea (adult) (pediatric): Secondary | ICD-10-CM | POA: Diagnosis not present

## 2024-03-08 DIAGNOSIS — H9072 Mixed conductive and sensorineural hearing loss, unilateral, left ear, with unrestricted hearing on the contralateral side: Secondary | ICD-10-CM | POA: Diagnosis not present

## 2024-03-08 DIAGNOSIS — M898X8 Other specified disorders of bone, other site: Secondary | ICD-10-CM | POA: Diagnosis not present

## 2024-03-08 DIAGNOSIS — E119 Type 2 diabetes mellitus without complications: Secondary | ICD-10-CM | POA: Diagnosis not present

## 2024-03-08 DIAGNOSIS — H908 Mixed conductive and sensorineural hearing loss, unspecified: Secondary | ICD-10-CM | POA: Diagnosis not present

## 2024-03-08 DIAGNOSIS — Z7985 Long-term (current) use of injectable non-insulin antidiabetic drugs: Secondary | ICD-10-CM | POA: Diagnosis not present

## 2024-03-08 DIAGNOSIS — Z79890 Hormone replacement therapy (postmenopausal): Secondary | ICD-10-CM | POA: Diagnosis not present

## 2024-03-20 DIAGNOSIS — Z9889 Other specified postprocedural states: Secondary | ICD-10-CM | POA: Diagnosis not present

## 2024-03-20 DIAGNOSIS — Z09 Encounter for follow-up examination after completed treatment for conditions other than malignant neoplasm: Secondary | ICD-10-CM | POA: Diagnosis not present

## 2024-03-22 DIAGNOSIS — Q165 Congenital malformation of inner ear: Secondary | ICD-10-CM | POA: Diagnosis not present

## 2024-03-26 DIAGNOSIS — J302 Other seasonal allergic rhinitis: Secondary | ICD-10-CM | POA: Diagnosis not present

## 2024-03-26 DIAGNOSIS — E1169 Type 2 diabetes mellitus with other specified complication: Secondary | ICD-10-CM | POA: Diagnosis not present

## 2024-03-26 DIAGNOSIS — E782 Mixed hyperlipidemia: Secondary | ICD-10-CM | POA: Diagnosis not present

## 2024-03-26 DIAGNOSIS — E039 Hypothyroidism, unspecified: Secondary | ICD-10-CM | POA: Diagnosis not present

## 2024-03-30 DIAGNOSIS — R001 Bradycardia, unspecified: Secondary | ICD-10-CM | POA: Diagnosis not present

## 2024-03-30 DIAGNOSIS — R079 Chest pain, unspecified: Secondary | ICD-10-CM | POA: Diagnosis not present

## 2024-04-06 DIAGNOSIS — L237 Allergic contact dermatitis due to plants, except food: Secondary | ICD-10-CM | POA: Diagnosis not present

## 2024-04-08 DIAGNOSIS — K7581 Nonalcoholic steatohepatitis (NASH): Secondary | ICD-10-CM | POA: Diagnosis not present

## 2024-04-08 DIAGNOSIS — K7469 Other cirrhosis of liver: Secondary | ICD-10-CM | POA: Diagnosis not present

## 2024-04-11 DIAGNOSIS — H90A32 Mixed conductive and sensorineural hearing loss, unilateral, left ear with restricted hearing on the contralateral side: Secondary | ICD-10-CM | POA: Diagnosis not present

## 2024-04-11 DIAGNOSIS — Z9889 Other specified postprocedural states: Secondary | ICD-10-CM | POA: Diagnosis not present

## 2024-04-11 DIAGNOSIS — Q165 Congenital malformation of inner ear: Secondary | ICD-10-CM | POA: Diagnosis not present

## 2024-04-11 DIAGNOSIS — Z9089 Acquired absence of other organs: Secondary | ICD-10-CM | POA: Diagnosis not present

## 2024-05-06 DIAGNOSIS — K7469 Other cirrhosis of liver: Secondary | ICD-10-CM | POA: Diagnosis not present

## 2024-05-06 DIAGNOSIS — K7581 Nonalcoholic steatohepatitis (NASH): Secondary | ICD-10-CM | POA: Diagnosis not present

## 2024-05-10 DIAGNOSIS — H35372 Puckering of macula, left eye: Secondary | ICD-10-CM | POA: Diagnosis not present

## 2024-05-10 DIAGNOSIS — H43812 Vitreous degeneration, left eye: Secondary | ICD-10-CM | POA: Diagnosis not present

## 2024-05-10 DIAGNOSIS — H43392 Other vitreous opacities, left eye: Secondary | ICD-10-CM | POA: Diagnosis not present

## 2024-05-17 DIAGNOSIS — K862 Cyst of pancreas: Secondary | ICD-10-CM | POA: Diagnosis not present

## 2024-05-17 DIAGNOSIS — K7460 Unspecified cirrhosis of liver: Secondary | ICD-10-CM | POA: Diagnosis not present

## 2024-06-19 DIAGNOSIS — Q165 Congenital malformation of inner ear: Secondary | ICD-10-CM | POA: Diagnosis not present

## 2024-06-19 DIAGNOSIS — H9193 Unspecified hearing loss, bilateral: Secondary | ICD-10-CM | POA: Diagnosis not present

## 2024-06-19 DIAGNOSIS — Z9889 Other specified postprocedural states: Secondary | ICD-10-CM | POA: Diagnosis not present

## 2024-06-19 DIAGNOSIS — Z0110 Encounter for examination of ears and hearing without abnormal findings: Secondary | ICD-10-CM | POA: Diagnosis not present

## 2024-06-19 DIAGNOSIS — H6121 Impacted cerumen, right ear: Secondary | ICD-10-CM | POA: Diagnosis not present

## 2024-06-19 DIAGNOSIS — H906 Mixed conductive and sensorineural hearing loss, bilateral: Secondary | ICD-10-CM | POA: Diagnosis not present

## 2024-06-19 DIAGNOSIS — Z9089 Acquired absence of other organs: Secondary | ICD-10-CM | POA: Diagnosis not present

## 2024-06-19 DIAGNOSIS — H90A21 Sensorineural hearing loss, unilateral, right ear, with restricted hearing on the contralateral side: Secondary | ICD-10-CM | POA: Diagnosis not present

## 2024-06-19 DIAGNOSIS — H90A32 Mixed conductive and sensorineural hearing loss, unilateral, left ear with restricted hearing on the contralateral side: Secondary | ICD-10-CM | POA: Diagnosis not present
